# Patient Record
Sex: Female | Born: 1950 | Race: White | Hispanic: No | Smoking: Current every day smoker
Health system: Southern US, Community
[De-identification: ages and names within clinical notes are randomized; demographics above are authoritative.]

## PROBLEM LIST (undated history)

## (undated) DIAGNOSIS — R3129 Other microscopic hematuria: Secondary | ICD-10-CM

## (undated) DIAGNOSIS — M3130 Wegener's granulomatosis without renal involvement: Secondary | ICD-10-CM

## (undated) DIAGNOSIS — F17200 Nicotine dependence, unspecified, uncomplicated: Secondary | ICD-10-CM

## (undated) DIAGNOSIS — E039 Hypothyroidism, unspecified: Secondary | ICD-10-CM

## (undated) DIAGNOSIS — K5790 Diverticulosis of intestine, part unspecified, without perforation or abscess without bleeding: Secondary | ICD-10-CM

## (undated) DIAGNOSIS — E669 Obesity, unspecified: Secondary | ICD-10-CM

## (undated) HISTORY — PX: CHOLECYSTECTOMY: SHX55

## (undated) HISTORY — DX: Diverticulosis of intestine, part unspecified, without perforation or abscess without bleeding: K57.90

## (undated) HISTORY — DX: Other microscopic hematuria: R31.29

## (undated) HISTORY — PX: TONSILLECTOMY: SHX5217

## (undated) HISTORY — DX: Wegener's granulomatosis without renal involvement: M31.30

## (undated) HISTORY — PX: APPENDECTOMY: SHX54

## (undated) HISTORY — DX: Hypothyroidism, unspecified: E03.9

## (undated) HISTORY — DX: Obesity, unspecified: E66.9

## (undated) HISTORY — DX: Nicotine dependence, unspecified, uncomplicated: F17.200

## (undated) HISTORY — PX: LUNG SURGERY: SHX703

---
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
## 2001-11-07 ENCOUNTER — Emergency Department (HOSPITAL_COMMUNITY): Admission: EM | Admit: 2001-11-07 | Discharge: 2001-11-07 | Payer: Self-pay | Admitting: Emergency Medicine

## 2001-11-15 ENCOUNTER — Encounter: Admission: RE | Admit: 2001-11-15 | Discharge: 2001-11-15 | Payer: Self-pay | Admitting: Internal Medicine

## 2001-12-17 ENCOUNTER — Encounter: Admission: RE | Admit: 2001-12-17 | Discharge: 2001-12-17 | Payer: Self-pay | Admitting: Internal Medicine

## 2002-01-09 ENCOUNTER — Encounter: Admission: RE | Admit: 2002-01-09 | Discharge: 2002-01-09 | Payer: Self-pay | Admitting: Internal Medicine

## 2002-01-16 ENCOUNTER — Encounter: Admission: RE | Admit: 2002-01-16 | Discharge: 2002-01-16 | Payer: Self-pay | Admitting: Internal Medicine

## 2002-06-21 ENCOUNTER — Encounter: Admission: RE | Admit: 2002-06-21 | Discharge: 2002-06-21 | Payer: Self-pay | Admitting: Family Medicine

## 2002-06-21 ENCOUNTER — Encounter: Payer: Self-pay | Admitting: Family Medicine

## 2003-07-22 ENCOUNTER — Emergency Department (HOSPITAL_COMMUNITY): Admission: EM | Admit: 2003-07-22 | Discharge: 2003-07-23 | Payer: Self-pay | Admitting: Emergency Medicine

## 2003-08-04 ENCOUNTER — Emergency Department (HOSPITAL_COMMUNITY): Admission: EM | Admit: 2003-08-04 | Discharge: 2003-08-04 | Payer: Self-pay | Admitting: Emergency Medicine

## 2003-09-25 ENCOUNTER — Other Ambulatory Visit: Admission: RE | Admit: 2003-09-25 | Discharge: 2003-09-25 | Payer: Self-pay

## 2005-06-01 ENCOUNTER — Encounter: Admission: RE | Admit: 2005-06-01 | Discharge: 2005-06-01 | Payer: Self-pay | Admitting: Family Medicine

## 2005-09-09 ENCOUNTER — Other Ambulatory Visit: Admission: RE | Admit: 2005-09-09 | Discharge: 2005-09-09 | Payer: Self-pay | Admitting: Family Medicine

## 2005-09-14 ENCOUNTER — Encounter: Admission: RE | Admit: 2005-09-14 | Discharge: 2005-09-14 | Payer: Self-pay | Admitting: Family Medicine

## 2007-03-08 ENCOUNTER — Encounter: Admission: RE | Admit: 2007-03-08 | Discharge: 2007-03-08 | Payer: Self-pay | Admitting: Family Medicine

## 2007-03-13 ENCOUNTER — Encounter: Admission: RE | Admit: 2007-03-13 | Discharge: 2007-03-13 | Payer: Self-pay | Admitting: Family Medicine

## 2007-05-31 DIAGNOSIS — M3130 Wegener's granulomatosis without renal involvement: Secondary | ICD-10-CM

## 2007-05-31 HISTORY — PX: VIDEO ASSISTED THORACOSCOPY (VATS)/THOROCOTOMY: SHX6173

## 2007-05-31 HISTORY — DX: Wegener's granulomatosis without renal involvement: M31.30

## 2007-07-25 ENCOUNTER — Emergency Department (HOSPITAL_COMMUNITY): Admission: EM | Admit: 2007-07-25 | Discharge: 2007-07-25 | Payer: Self-pay | Admitting: Family Medicine

## 2007-07-26 ENCOUNTER — Emergency Department (HOSPITAL_COMMUNITY): Admission: EM | Admit: 2007-07-26 | Discharge: 2007-07-26 | Payer: Self-pay | Admitting: Family Medicine

## 2007-09-04 ENCOUNTER — Encounter: Admission: RE | Admit: 2007-09-04 | Discharge: 2007-09-04 | Payer: Self-pay | Admitting: Family Medicine

## 2007-09-12 ENCOUNTER — Ambulatory Visit: Payer: Self-pay | Admitting: Pulmonary Disease

## 2007-09-12 ENCOUNTER — Inpatient Hospital Stay (HOSPITAL_COMMUNITY): Admission: EM | Admit: 2007-09-12 | Discharge: 2007-09-18 | Payer: Self-pay | Admitting: Emergency Medicine

## 2007-09-13 ENCOUNTER — Encounter: Payer: Self-pay | Admitting: Pulmonary Disease

## 2007-09-13 ENCOUNTER — Encounter (INDEPENDENT_AMBULATORY_CARE_PROVIDER_SITE_OTHER): Payer: Self-pay | Admitting: Interventional Radiology

## 2007-09-14 ENCOUNTER — Encounter: Payer: Self-pay | Admitting: Pulmonary Disease

## 2007-09-17 ENCOUNTER — Encounter (INDEPENDENT_AMBULATORY_CARE_PROVIDER_SITE_OTHER): Payer: Self-pay | Admitting: Interventional Radiology

## 2007-09-17 ENCOUNTER — Encounter: Payer: Self-pay | Admitting: Pulmonary Disease

## 2007-09-19 ENCOUNTER — Telehealth: Payer: Self-pay | Admitting: Emergency Medicine

## 2007-09-20 ENCOUNTER — Telehealth: Payer: Self-pay | Admitting: Pulmonary Disease

## 2007-09-27 ENCOUNTER — Ambulatory Visit: Payer: Self-pay | Admitting: Pulmonary Disease

## 2007-09-27 ENCOUNTER — Encounter: Payer: Self-pay | Admitting: Pulmonary Disease

## 2007-09-27 DIAGNOSIS — J984 Other disorders of lung: Secondary | ICD-10-CM | POA: Insufficient documentation

## 2007-09-27 DIAGNOSIS — J90 Pleural effusion, not elsewhere classified: Secondary | ICD-10-CM | POA: Insufficient documentation

## 2007-09-27 LAB — CONVERTED CEMR LAB
Angiotensin 1 Converting Enzyme: 27 U/L
Anti Nuclear Antibody(ANA): NEGATIVE
Rheumatoid fact SerPl-aCnc: 223.2 [IU]/mL — ABNORMAL HIGH

## 2007-10-09 ENCOUNTER — Telehealth: Payer: Self-pay | Admitting: Pulmonary Disease

## 2007-10-11 ENCOUNTER — Other Ambulatory Visit: Admission: RE | Admit: 2007-10-11 | Discharge: 2007-10-11 | Payer: Self-pay | Admitting: Obstetrics and Gynecology

## 2007-10-17 ENCOUNTER — Encounter: Payer: Self-pay | Admitting: Pulmonary Disease

## 2007-10-25 ENCOUNTER — Ambulatory Visit: Admission: RE | Admit: 2007-10-25 | Discharge: 2007-10-25 | Payer: Self-pay | Admitting: Pulmonary Disease

## 2007-10-26 ENCOUNTER — Telehealth: Payer: Self-pay | Admitting: Pulmonary Disease

## 2007-10-29 ENCOUNTER — Telehealth: Payer: Self-pay | Admitting: Pulmonary Disease

## 2007-10-30 ENCOUNTER — Telehealth (INDEPENDENT_AMBULATORY_CARE_PROVIDER_SITE_OTHER): Payer: Self-pay | Admitting: *Deleted

## 2007-11-06 ENCOUNTER — Ambulatory Visit: Payer: Self-pay | Admitting: Thoracic Surgery

## 2007-11-09 ENCOUNTER — Inpatient Hospital Stay (HOSPITAL_COMMUNITY): Admission: RE | Admit: 2007-11-09 | Discharge: 2007-11-14 | Payer: Self-pay | Admitting: Thoracic Surgery

## 2007-11-09 ENCOUNTER — Ambulatory Visit: Payer: Self-pay | Admitting: Pulmonary Disease

## 2007-11-09 ENCOUNTER — Ambulatory Visit: Payer: Self-pay | Admitting: Thoracic Surgery

## 2007-11-09 ENCOUNTER — Encounter: Payer: Self-pay | Admitting: Thoracic Surgery

## 2007-11-13 ENCOUNTER — Encounter: Payer: Self-pay | Admitting: Pulmonary Disease

## 2007-11-14 ENCOUNTER — Telehealth: Payer: Self-pay | Admitting: Pulmonary Disease

## 2007-11-14 DIAGNOSIS — M3130 Wegener's granulomatosis without renal involvement: Secondary | ICD-10-CM | POA: Insufficient documentation

## 2007-11-15 ENCOUNTER — Telehealth: Payer: Self-pay | Admitting: Pulmonary Disease

## 2007-11-16 ENCOUNTER — Telehealth (INDEPENDENT_AMBULATORY_CARE_PROVIDER_SITE_OTHER): Payer: Self-pay | Admitting: *Deleted

## 2007-11-19 ENCOUNTER — Telehealth (INDEPENDENT_AMBULATORY_CARE_PROVIDER_SITE_OTHER): Payer: Self-pay | Admitting: *Deleted

## 2007-11-20 ENCOUNTER — Telehealth: Payer: Self-pay | Admitting: Pulmonary Disease

## 2007-11-21 ENCOUNTER — Ambulatory Visit: Payer: Self-pay | Admitting: Thoracic Surgery

## 2007-11-21 ENCOUNTER — Encounter: Admission: RE | Admit: 2007-11-21 | Discharge: 2007-11-21 | Payer: Self-pay | Admitting: Thoracic Surgery

## 2007-11-22 ENCOUNTER — Ambulatory Visit: Payer: Self-pay | Admitting: Pulmonary Disease

## 2007-11-27 ENCOUNTER — Telehealth (INDEPENDENT_AMBULATORY_CARE_PROVIDER_SITE_OTHER): Payer: Self-pay | Admitting: *Deleted

## 2007-11-28 ENCOUNTER — Ambulatory Visit: Payer: Self-pay | Admitting: Hematology & Oncology

## 2007-11-29 ENCOUNTER — Encounter: Payer: Self-pay | Admitting: Pulmonary Disease

## 2007-11-29 ENCOUNTER — Telehealth (INDEPENDENT_AMBULATORY_CARE_PROVIDER_SITE_OTHER): Payer: Self-pay | Admitting: *Deleted

## 2007-11-29 LAB — CONVERTED CEMR LAB
Basophils Absolute: 0 10*3/uL (ref 0.0–0.1)
Chloride: 101 meq/L (ref 96–112)
Eosinophils Absolute: 0.1 10*3/uL (ref 0.0–0.7)
GFR calc non Af Amer: 92 mL/min
MCHC: 34.1 g/dL (ref 30.0–36.0)
MCV: 97 fL (ref 78.0–100.0)
Neutrophils Relative %: 54.1 % (ref 43.0–77.0)
Platelets: 600 10*3/uL — ABNORMAL HIGH (ref 150–400)
Potassium: 3.9 meq/L (ref 3.5–5.1)
RDW: 15.3 % — ABNORMAL HIGH (ref 11.5–14.6)
Sodium: 138 meq/L (ref 135–145)
WBC: 9.2 10*3/uL (ref 4.5–10.5)

## 2007-12-04 ENCOUNTER — Ambulatory Visit (HOSPITAL_COMMUNITY): Admission: RE | Admit: 2007-12-04 | Discharge: 2007-12-04 | Payer: Self-pay | Admitting: Pulmonary Disease

## 2007-12-04 ENCOUNTER — Encounter: Payer: Self-pay | Admitting: Pulmonary Disease

## 2007-12-06 ENCOUNTER — Telehealth (INDEPENDENT_AMBULATORY_CARE_PROVIDER_SITE_OTHER): Payer: Self-pay | Admitting: *Deleted

## 2007-12-11 ENCOUNTER — Telehealth (INDEPENDENT_AMBULATORY_CARE_PROVIDER_SITE_OTHER): Payer: Self-pay | Admitting: *Deleted

## 2007-12-18 ENCOUNTER — Encounter: Payer: Self-pay | Admitting: Pulmonary Disease

## 2008-07-16 IMAGING — CT CT CHEST W/ CM
2 of 3 series · 15 of 36 positions shown, 18 images · IV contrast (agent unspecified)
Comparison: Previous chest radiographs, the most recent dated
09/27/2007.

CLINICAL DATA: Chest pain shortness of breath.  Chest masses on
recent chest radiographs.

CT CHEST WITH CONTRAST
TECHNIQUE: Multidetector CT imaging of the chest was performed
following the standard protocol during bolus administration of
intravenous contrast.
Contrast: 80 ml Kmnipaque-TQQ

[Series 2: chest routine 5.0 b40f · axial · 0.67mm/px · z∈[-324,-34]mm · 12 of 68 slices shown, 15 images]
[im 5/68  mediastinal]
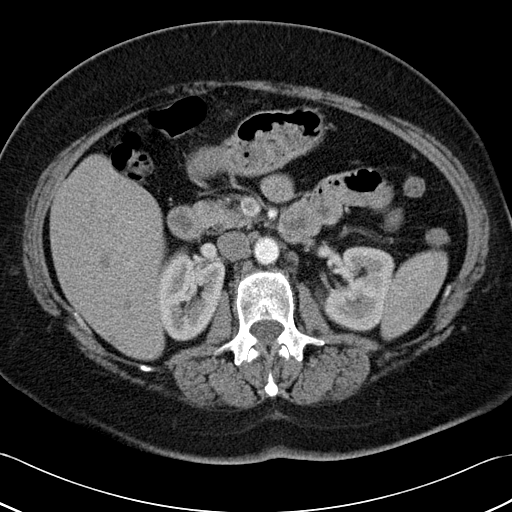
[im 5/68  lung]
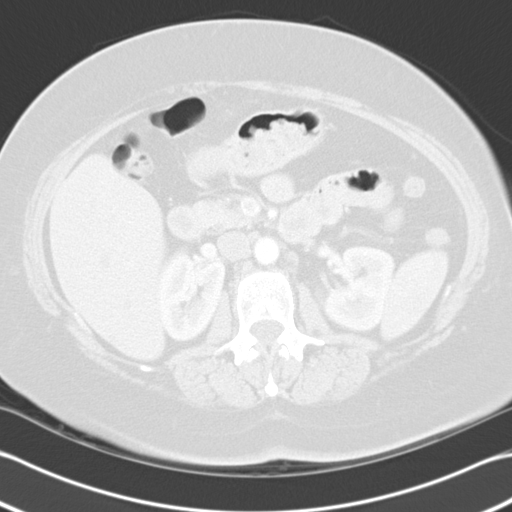
[im 10/68  lung]
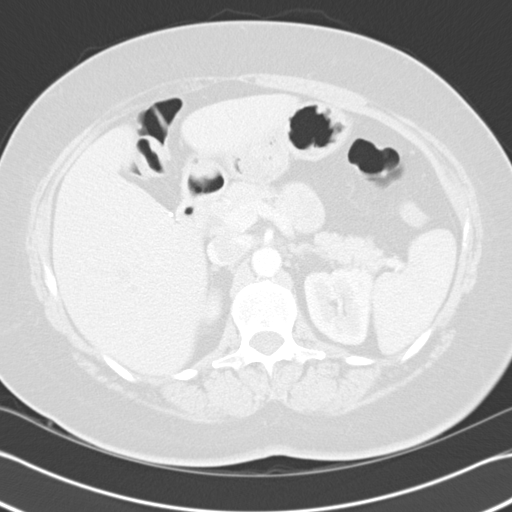
[im 15/68  lung]
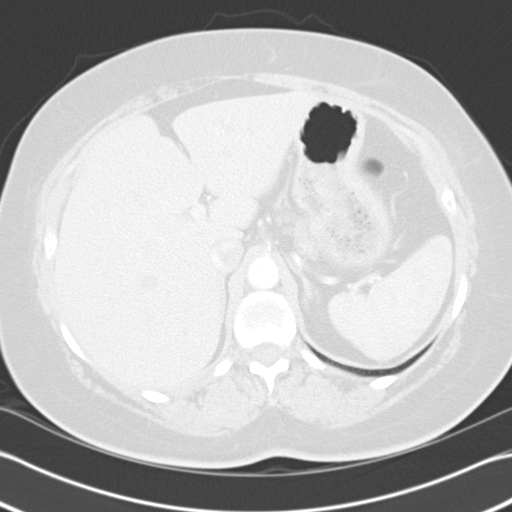
[im 20/68  lung]
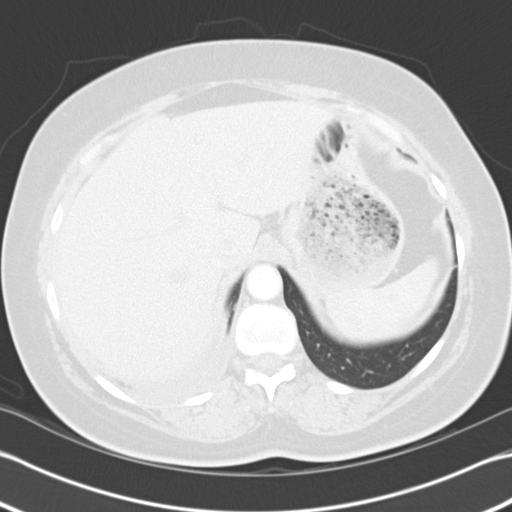
[im 25/68  mediastinal]
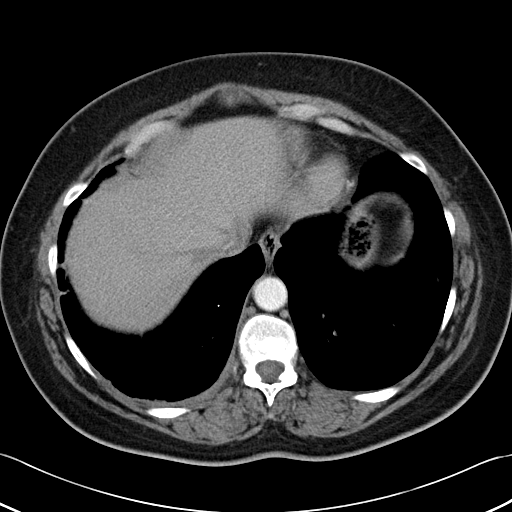
[im 25/68  lung]
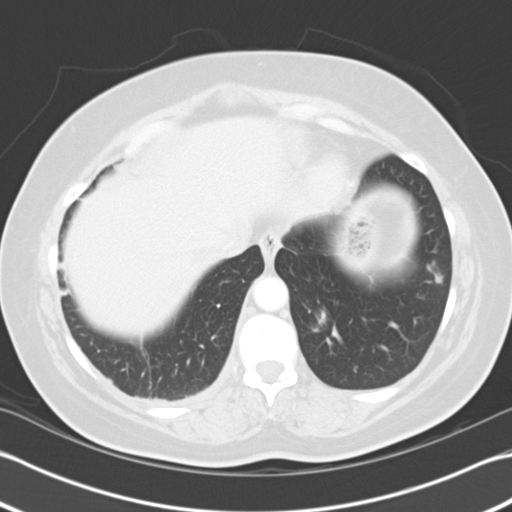
[im 30/68  lung]
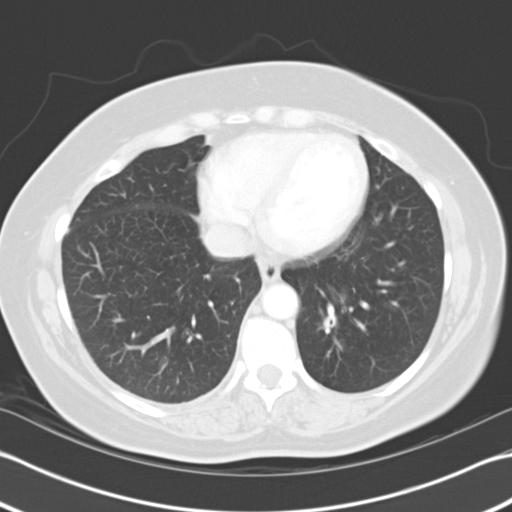
[im 38/68  lung]
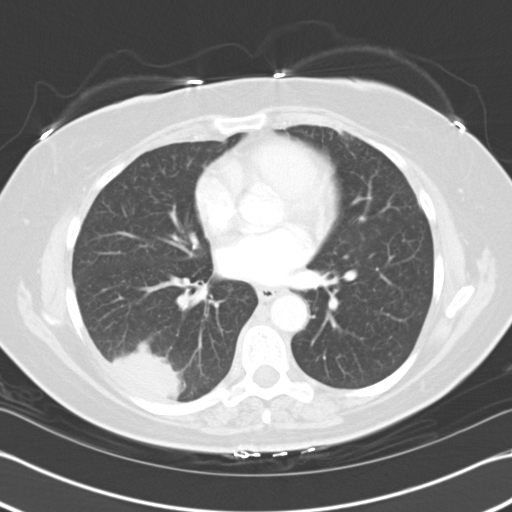
[im 43/68  lung]
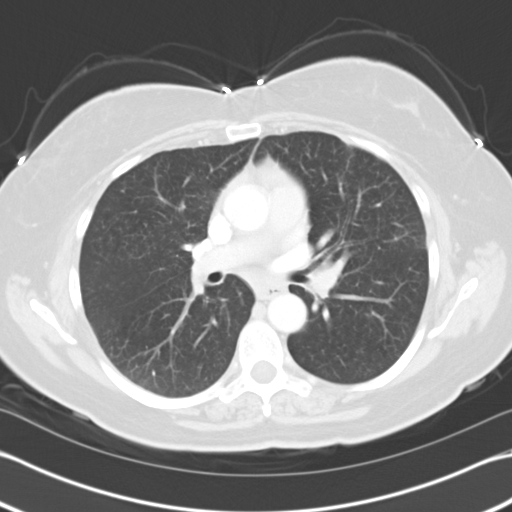
[im 48/68  mediastinal]
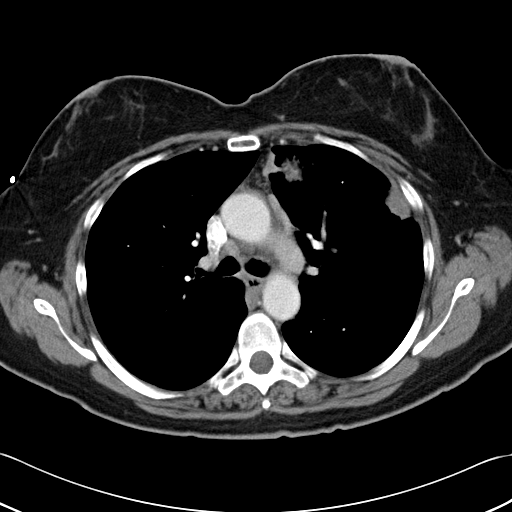
[im 48/68  lung]
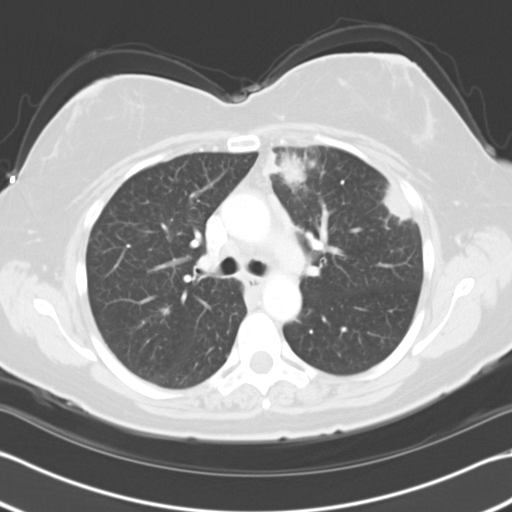
[im 53/68  lung]
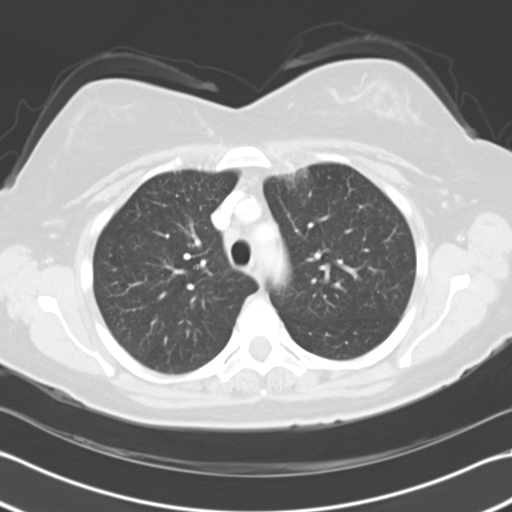
[im 58/68  lung]
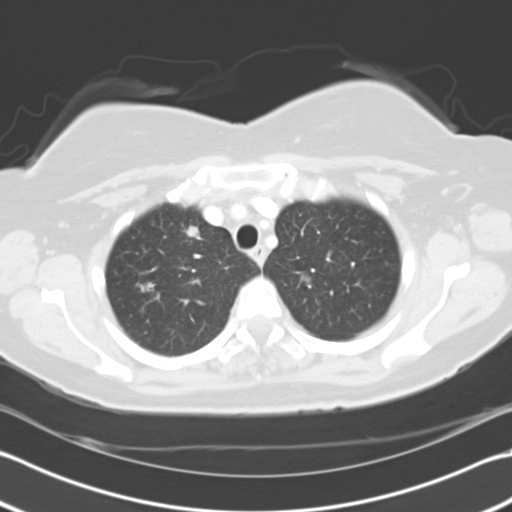
[im 63/68  lung]
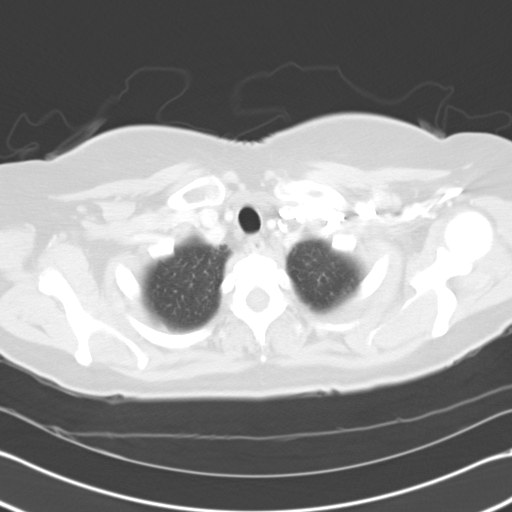

[Series 602: <mpr thick range> · coronal · 0.67mm/px · 3 of 87 slices shown]
[im 18/87  lung]
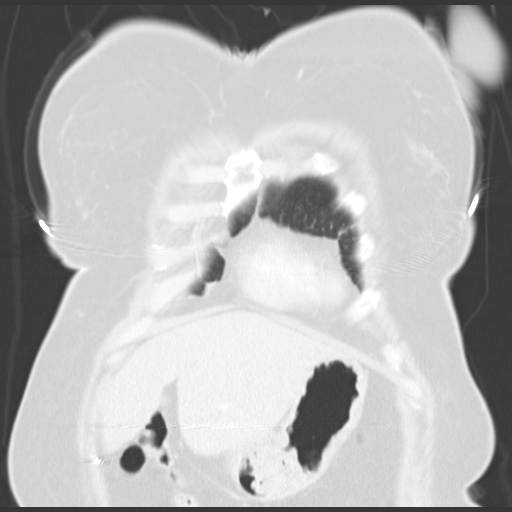
[im 35/87  lung]
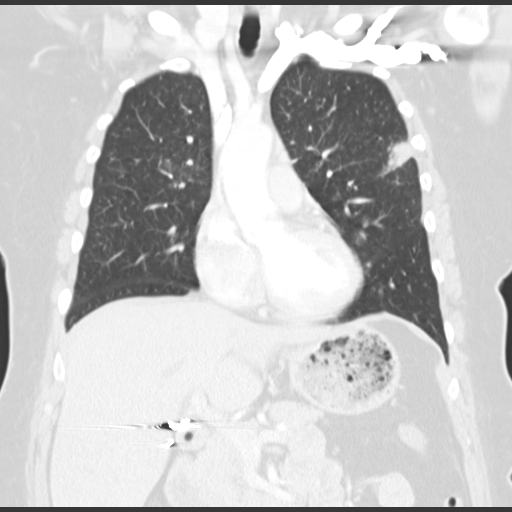
[im 52/87  lung]
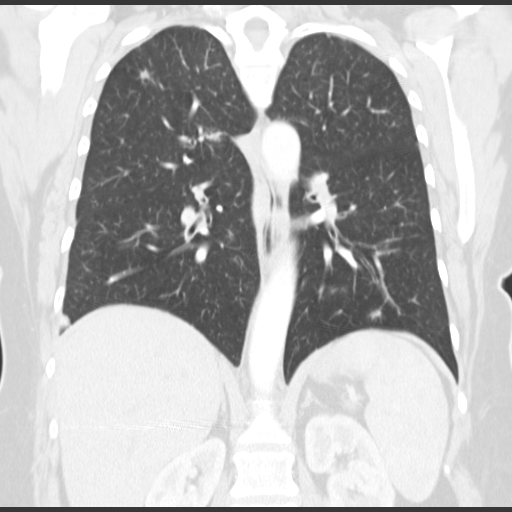

[15 of 36 positions shown; findings below may reference images not displayed]

FINDINGS: Multiple bilateral, irregularly marginated lung masses.
The largest mass on the right is in the posterior subpleural region
of the right lower lobe.  This mass measures 5.2 x 2.7 cm in
maximum dimensions on image number 32.  The largest mass on the
left is in the in the anterior aspect of the left upper lobe,
measuring 3.6 x 2.5 cm in maximum dimensions on image number 20.
No enlarged lymph nodes are seen.  Small amount of right pleural
fluid.  Cholecystectomy clips.  Normal appearing adrenal glands and
included portions of the the liver.  Mild thoracic spine
degenerative changes.
IMPRESSION: 1.  Multiple bilateral, irregularly marginated lung masses.  These
are suspicious for metastases.  An infectious process, such as
fungal disease, is also a possibility.
2.  Small right pleural effusion.

## 2008-08-19 ENCOUNTER — Ambulatory Visit (HOSPITAL_COMMUNITY): Admission: RE | Admit: 2008-08-19 | Discharge: 2008-08-19 | Payer: Self-pay | Admitting: Unknown Physician Specialty

## 2010-06-20 ENCOUNTER — Encounter: Payer: Self-pay | Admitting: Family Medicine

## 2010-10-12 NOTE — Assessment & Plan Note (Signed)
OFFICE VISIT   [name], [name]  DOB:  [birth year]                                        November 21, 2007  CHART #:  [id number]   REASON FOR OFFICE VISIT:  Followup appointment status post right VATS,  right minithoracotomy with wedge resection.  Biopsies of right upper,  right middle, and right lower lobe, nodules with lysis of adhesions by  Dr. Edwyna Shell on November 09, 2007.   HISTORY OF PRESENT ILLNESS:  This is a pleasant 60 year old Caucasian  female patient of Dr. Felipa Eth, who had complaints of worsening dyspnea.  Please see medical records for detailed history.   The patient was found to have multiple pulmonary nodules on the right  lung and underwent the aforementioned lung procedure.  Frozen section  revealed an inflammatory process.  Final pathology revealed Wegener's  granulomatosis.  The patient did fairly well postoperatively.  Since  having been discharged home from the hospital, the patient does have  some complaints of incisional pain, but denies any shortness of breath,  chest pain, fever, chills, or night sweats.   PHYSICAL EXAMINATION:  GENERAL:  This is a pleasant 60 year old  Caucasian female in no acute distress.  She is alert and cooperative.  VITAL SIGNS:  BP 143/85, pulse rate 99, respiration rate 24, and O2 sat  95% on room air.  CARDIOVASCULAR:  Slightly tachycardiac.  PULMONARY:  Clear to auscultation bilaterally.  No rales, wheezes, or  rhonchi.  EXTREMITIES:  No cyanosis, clubbing, or edema.  CHEST:  Right chest wound well healed.  Several chest tube sutures are  removed.  Scabs present over chest tube site.   IMAGING STUDIES:  Chest x-ray done today reveals improved aeration of  the right mid and lower lung, improvement of subcutaneous air in the  right, and no pneumothorax.   IMPRESSION AND PLAN:  1. Surgically stable, status post right VATS, right minithoracotomy      for dissection and biopsy of the right upper, middle, and  lower      lobes.  2. Wegener granulomatosis.  The patient has recently been placed on      prednisone 60 mg p.o. daily.  The patient has a return appointment      to see Dr. Felipa Eth on November 23, 2007.  The patient was instructed that      she may drive.  She is not to swim for several weeks.  She is to      continue her incentive spirometer and walk daily and increase her      frequency and duration as tolerated.  She may return to work on a      part-time basis for the next week and then return full-time      thereafter.  She is to contact our office for any further questions      or concerns.   Ines Bloomer, M.D.  Electronically Signed   DZ/MEDQ  D:  11/21/2007  T:  11/22/2007  Job:  [id number]   cc:   Larina Earthly, M.D.

## 2010-10-12 NOTE — Op Note (Signed)
NAMEALBERTA, LENHARD               ACCOUNT NO.:  [id number]   MEDICAL RECORD NO.:  [id number]          PATIENT TYPE:  INP   LOCATION:  2303                         FACILITY:  [facility]   PHYSICIAN:  Ines Bloomer, M.D. DATE OF BIRTH:  [birth year]   DATE OF PROCEDURE:  DATE OF DISCHARGE:                               OPERATIVE REPORT   PREOPERATIVE DIAGNOSIS:  Bilateral pulmonary nodules.   POSTOPERATIVE DIAGNOSES:  Bilateral pulmonary nodules.   OPERATION PERFORMED:  Left video-assisted thoracic surgery  minithoracotomy, wedge resection, right upper lobe nodule lysis of  adhesions, and right lower lobe nodule, and right middle lobe nodule.   SURGEON:  Ines Bloomer, MD.   General anesthesia.   FIRST ASSISTANT:  Coral Ceo, PA-C.   This patient presented with bilateral pulmonary infiltrate and nodules.  Previous biopsy showed a questionable granulomatous process.  A  thoracoscopy was made.  Her nodules progressed.  She is having a lot of  pleuritic-type chest pain particularly on the right, then previous  biopsy had been known on the left side, so we decided to do biopsies on  the right side.  She underwent general anesthesia and was turned to the  right lateral thoracotomy position.  Two trocar sites were made in the  anterior and posterior axons in the seventh intercostal space.  We  attempted to insert two trocars, but there was marked amount of  adhesions.  For this reason, we did a minithoracotomy at the fifth  intercostal space partially dividing the latissimus and spreading the  serratus, and we were able to take down that adhesions to get a scope  and there two pictures were taken of the marked adhesions.  We then used  the scope under direct vision to take down the adhesions medially in the  right upper lobe where there was a 1-cm-1.5-cm nodule which was grasped  with a uvula-tongue clamp and then resected using the Covidien stapler,  the 4.5-mm stapler.  This was  sent for frozen section.  While this was  being done, more adhesions were taken down of the middle lobe and  another lesion was seen in the middle lobe and this was resected with  the Covidien stapler with the 45 and 60 staplers.  Finally, the  adhesions were taken down in the lower lobe in the superior segment.  There was at least a 3-4 cm lesion.  We had to take the lower lobe off  the diaphragm and then off the lateral wall in order to resect the  lesion up enough to excise.  The major fissure was partially divided  with the Auto suture 60 stapler and then we resected it with several  applications of the Auto suture 60 stapler.  Several small tears of the  lung were oversewn with a 3-0 Vicryl.  All bleeding was  electrocoagulated.  Chest tubes were brought in through both chest tube  sites and tied in place with 0 silk.  Frozen section revealed  inflammatory process, which we requested to be sent to Dr. Leandro Reasoner in  [place], [place].  On-Q was placed  in the usual fashion.  The chest  was  closed with two pericostal drilling through the sixth rib and passed  around the fifth rib, #1 Vicryl in the muscle layer.  Marcaine block was  done in the usual fashion and Dermabond for the skin.  The patient was  returned to recovery room in stable condition.      Ines Bloomer, M.D.  Electronically Signed     DPB/MEDQ  D:  11/09/2007  T:  11/10/2007  Job:  [id number]   cc:   Larina Earthly, M.D.

## 2010-10-12 NOTE — Discharge Summary (Signed)
[name], [name]               ACCOUNT NO.:  [id number]   MEDICAL RECORD NO.:  [id number]          PATIENT TYPE:  INP   LOCATION:  3301                         [facility]AC[name]   PHYSICIAN:  [name], M.D. DATE OF BIRTH:  [birth year]   DATE OF ADMISSION:  11/09/2007  DATE OF DISCHARGE:  11/14/2007                               DISCHARGE SUMMARY   ADMITTING DIAGNOSES:  1. Bilateral pulmonary nodules.  2. History of hypothyroidism.   DISCHARGE DIAGNOSES:  1. Bilateral pulmonary nodules.  2. History of hypothyroidism.   PROCEDURE:  Right video-assisted thoracic surgery, right mini  thoracotomy with wedge resection, biopsies of the right upper, right  middle, and right lower lobe nodules with lysis of adhesions by Dr.  Edwyna Bailey on November 09, 2007.   HOSPITAL COURSE STAY:  This is a 60 year old Caucasian female, patient  of Dr. Felipa Bailey, who according to medical records had repeated episodes of  bronchitis over the past several months.  She was treated with  antibiotics.  She eventually saw Dr. Laurine Bailey for worsening dyspnea, and  a spiral CT of the chest was done.  There was a 2-cm pleural base mass  found in the left upper lobe and a small air spiculation in the right  upper lobe as well as a small-to-moderate pleural effusion with  atelectasis at the right lower lobe.  She underwent thoracentesis of the  right effusion which revealed exudate with eosinophils.  She was  subsequently found to have multiple pulmonary nodules, and a biopsy of  the left upper lobe nodule was consistent with non-necrotizing  granulomatous inflammation.  Fungal, AFB, and PPD were negative.  Rheumatoid factor was positive.  She had a repeat CAT scan done on Oct 17, 2007, which showed a small right pleural effusion, bilateral  pulmonary nodules (the largest being in the right lower lobe).  She was  admitted to [facility] on November 09, 2007, to undergo the aforementioned  right lung surgery.   Postoperatively, the patient remained  hemodynamically stable.  Her posterior chest tube was removed.  On November 11, 2007, followup x-ray revealed a tiny right apical pneumothorax.  The  patient had complaints of itching she felt secondary to the fentanyl  PCA.  She was given Atarax for this.  She also had complaints of  nauseousness on postoperative day #3.  This was relieved with Zofran.  PCA was discontinued on the patient's request on postoperative day #3.  X-ray again on this day revealed a stable tiny right apical pneumothorax  as well as some minimal right subcutaneous emphysema, also noted was a  left focal density.  The patient complains of a nonproductive cough on  postoperative day #4 as well as some incisional pain.  She had Percocet,  Ultram, and Darvocet as needed for pain; however, the patient preferred  Darvocet.  In addition, the patient was found to have a potassium of  3.5.  This was supplemented.  Again, she remains afebrile.  Vital signs  were stable.  She is mostly going to be discharged on November 14, 2007.  Latest chest  x-ray done today again revealed a tiny stable right apical  pneumothorax, bibasilar atelectasis, small amount of subcutaneous  emphysema on the right and a density at the left mid lung zone as seen  previously.  Latest laboratory studies revealed the CBC done today H&H  is stable at 9.9 and 29 respectively, white count of 9500, and platelet  count 274,000.  BMET done today, potassium 3.5, sodium 134, BUN and  creatinine 0.6 and 3 respectively.   DISCHARGE INSTRUCTIONS:  Include the following:  The patient is to walk  daily and continue to use her incentive spirometer.  She may shower.  She is not to bathe.  She is not to lift or drive for 3 weeks.  Followup  appointments include Dr. Edwyna Bailey on November 21, 2007.  Prior to this  appointment, a chest x-ray will be taken.  In addition, she is to follow  up with Dr. Felipa Bailey in 1-2 weeks.  She needs to call his  office for an  appointment.   DISCHARGE MEDICATIONS:  Include the following:  1. Vitamin C 1000 mg p.o. daily.  2. Fish oil 1000 mg p.o. daily.  3. Prenatal vitamin p.o. q.a.m.  4. Levothyroxine 125 mcg p.o. daily.  5. Celebrex 200 mg p.o. twice daily.  6. Darvocet-N 100 1-2 tablets p.o. q.4-6h. p.r.n. pain.      [name], [place]      [name], M.D.  Electronically Signed    DZ/MEDQ  D:  11/13/2007  T:  11/14/2007  Job:  [id number]   cc:   [name], M.D.

## 2010-10-12 NOTE — Consult Note (Signed)
NAMEBRITISH, [name]               ACCOUNT NO.:  [id number]   MEDICAL RECORD NO.:  [id number]          PATIENT TYPE:  INP   LOCATION:  4710                         FACILITY:  [facility]   PHYSICIAN:  Oretha Milch, MD      DATE OF BIRTH:  [birth year]   DATE OF CONSULTATION:  09/14/2007  DATE OF DISCHARGE:                                 CONSULTATION   REFERRING PHYSICIAN:  Kela Millin, M.D.   REASON FOR CONSULTATION:  Pulmonary nodule.   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  She has been  struggling with repeated episodes of bronchitis over the past few  months, which has been treated with antibiotics.  She presented to  urgent care on July 25, 2007.  A chest x-ray done showed a density  in the right lower lung zone, which was felt to be a summation shadow.  She saw Dr. Paulino Rily for worsening dyspnea and a spiral CT chest was  performed.  I have reviewed these images from the [facility].  The patient has a CD-ROM with her, this did not show any  intraluminal filling defects, but showed a 2.0 cm pleural-based mass in  the left upper lobe.  There is a small area of spiculation in the right  upper lobe seen on image 12.  A small-to-moderate right pleural effusion  was seen with some atelectasis of the right lower lobe.  She  subsequently underwent ultrasound-guided thoracentesis of this right  effusion after admission to the hospital.  Pleural fluid results showed  protein level of 4.4, an LDH of 272, glucose of 104.  There were 9505  white cells with 37% neutrophils, 30% lymphocytes, and 28% eosinophils.  Some mesothelial cells were also present.  Culture was negative and  pleural fluid triglyceride level was 43.  Amylase level was 41.  CEA  level was less than 0.5 and CA-125 level was elevated at 114.  ESR was  56.   The patient reports that her cough has improved.  She denies  hemoptysis,  anorexia, fevers, or weight loss.  She reports a nagging pain in the  right lower chest area.   PAST MEDICAL HISTORY:  Includes an abnormal mammogram with normal  followup ultrasound on September 04, 2007.   PAST SURGICAL HISTORY:  None.   ALLERGIES:  CODEINE.   MEDICATIONS AT HOME:  1. Synthroid 125 mcg daily.  2. Multivitamin.   CURRENT MEDICATIONS:  1. Atrovent and albuterol nebs q.8 h. p.r.n.  2. Nicotine 21-mg patch.  3. Protonix 40 mg p.o. daily.  4. Hydrocodone APAP q.4 h. p.r.n. for pain.   SOCIAL HISTORY:  She smoked pack and a half per day for many years.  Over the past few months, she has cut down to about half a pack per day.  Denies alcohol use.  She works as a Investment banker, corporate.   PHYSICAL EXAMINATION:  GENERAL:  A dull woman sitting up in bed in no  apparent respiratory distress.  VITAL SIGNS:  Respirations 18  per minute, heart rate 84 per minute,  oxygen saturation 97% on room air and blood pressure 132/84.  HEENT:  No thrush.  NECK:  Supple.  No JVD, no lymphadenopathy.  CVS:  S1, S2 normal.  CHEST:  Decreased breath sounds both bases.  No rhonchi.  ABDOMEN:  Soft, nontender.  No organomegaly.  NEUROLOGIC:  Nonfocal.  EXTREMITIES:  No edema.  No clubbing.   LABORATORY DATA:  WBC count 7.4, hemoglobin 13.3, platelets 256, 60%  neutrophils, 22% lymphocytes.  INR 0.9.  BUN and creatinine was 5 and  0.66, calcium was 8.6.  Sodium 140, potassium 4.2, bicarbonate 29.   IMAGING STUDIES:  As described above.   IMPRESSION:  1. Exudative right pleural effusion with increased eosinophils.  2. Left upper lobe, 2.0-cm pleural-based nodule as described.  3. Spiculated lesion in the right upper lobe.   The differential diagnosis of this appearance is very suspicious for  metastatic malignancy, especially in the setting of an elevated CA-125  level.  Her CT abdomen results are pending.  I discussed the  radiographic appearance and the possibilities with the  patient in great  detail.  1. I will await a pleural fluid cytology report.  If that is negative,      we will pursue a CT-guided aspiration of left upper lobe mass.  2. Risks of the procedure including coughing, bleeding, and a small      chance of lung puncture requiring chest tube were discussed with      the patient in great detail, and she evidenced understanding.      Oretha Milch, MD  Electronically Signed     RVA/MEDQ  D:  09/14/2007  T:  09/15/2007  Job:  [id number]

## 2010-10-12 NOTE — Discharge Summary (Signed)
[name], [name]               ACCOUNT NO.:  [id number]   MEDICAL RECORD NO.:  [id number]          PATIENT TYPE:  OUT   LOCATION:  PULM                         FACILITY:  [facility]   PHYSICIAN:  Ramiro Harvest, MD    DATE OF BIRTH:  [birth year]   DATE OF ADMISSION:  12/04/2007  DATE OF DISCHARGE:  09/18/2007                               DISCHARGE SUMMARY   PRIMARY CARE PHYSICIAN:  Dr. Johnn Hai of [facility].   DISCHARGE DIAGNOSES:  1. Left upper lobe lung mass/right exudative pleural effusion.  2. Hypothyroidism.  3. Tobacco abuse.   DISCHARGE MEDICATIONS:  1. Synthroid 0.125 mg p.o. daily.  2. Prenatal vitamins p.o. daily.  3. Vicodin 5/500 one tablet p.o. every 4 hours p.r.n.  4. Nicotine patch  21 mg TD daily.  5. Albuterol Nebs every 4 hours p.r.n.   DISPOSITION AND FOLLOWUP:  Patient will be discharged home.  Patient is  to follow up with Dr. Vassie Loll of [facility] on Sep 28, 2007 at 1:30 p.m. for followup of pathology results of her CT guided  biopsy results of her left upper lobe mass.  Patient will also likely  need PFTs done and smoking cessation done as well, and also will be  followed up with Dr. Vassie Loll in terms of the right pleural effusion and  spiculated upper lobe mass.  Patient will need followup on this left  upper lobe mass and pathology results on the CT guided  biopsy of this  mass.   CONSULTATIONS:  1. Interventional Radiology was consulted on September 17, 2007 for CT      guided biopsy of the mass.  2. Pulmonary Medicine was consulted on September 14, 2007.  Patient was      seen in consultation by Dr. Vassie Loll.   PROCEDURES PERFORMED:  1. A chest x-ray was performed on September 13, 2007 that showed a new,      small, moderate right pleural effusion with basilar atelectasis,      possibly new left lung pulmonary nodal versus  artifact overlying      the left 7th rib.  Attention on followup.  2. Another chest x-ray was obtained on September 13, 2007, post      thoracentesis that showed a decreased amount of right pleural      effusion with no evidence of pneumothorax, nodular pulmonary      opacity in the lower right lung zone is seen.  Vague nodular      density seen on the left.  Suggest CT examination for further      evaluation of pulmonary nodularity.  3. Bilateral chest decubitus x-rays were obtained on September 13, 2007      that showed layering right pleural effusion on dependent right      costophrenic angle does not clear, which may relate to passive      atelectasis or consolidation.  4. An ultrasound guided thoracentesis was done on September 13, 2007.  5. A CT of the abdomen and pelvis was done on September 14, 2007 that  showed a negative abdominal CT scan and unremarkable pelvic CT.  6. A CT guided biopsy of the left upper lobe mass was done on September 17, 2007.  7. Chest x-ray was also done on April 20, 22009 that showed no      pneumothorax post biopsy.   BRIEF HOSPITAL ADMISSION HISTORY AND PHYSICAL:  [name] is a  60 year old female with history of tobacco abuse and hypothyroidism, who  had for the past 3 months prior to presentation, had been having  coughing and worsening shortness of breath, which had been progressively  getting worse.  She presented to her PCP for a few times, had a chest x-  ray done with a diagnosis of bronchitis and treated accordingly.  For  the past one week prior to admission, patient started to have worsening  shortness of breath at rest at which point a CT scan was done per PE  protocol, which showed a small 2 cm nodular mass in the left pleural  base as well as a right-sided, moderate sized pleural effusion with  another 1 cm spiculated mass/nodule on the right.  Patient was informed  of the results and was sent to the emergency room for further evaluation  and admission.   PHYSICAL EXAM PER ADMITTING PHYSICIAN:  Temperature 98.0, blood pressure  140/94, respiratory  rate 18, heart rate 84, satting 96% on room air.  GENERAL:  Patient in no distress, lying on stretcher.  HEENT:  Normocephalic, atraumatic.  Pupils equal, round and reactive to  light.  Extraocular movements were intact.  Moist mucous membranes.  No  lymphadenopathy noted.  CARDIOVASCULAR:  Regular rate, rhythm.  No murmurs, rubs or gallops.  RESPIRATORY:  Decreased air movement on the right with no breath sounds  up to 1/3 from the base.  ABDOMEN:  Soft, nontender, nondistended.  EXTREMITIES:  No clubbing, cyanosis or edema.  BREASTS:  Had fibrous tissue noted bilaterally.  No overt masses noted.  No lymphadenopathy noted throughout.  NEUROLOGICALLY:  Patient was alert and oriented x3.  Cranial nerves II-  XII were grossly intact.  No focal deficits.   LABORATORY DATA:  CBC:  White count 10.2, hemoglobin 14, INR 0.9,  platelets 307, sodium 139, potassium 4.1, creatinine 0.6, alkaline  phosphatase 899, total protein 7.2, albumin 3.7, calcium 9.3.  CT scan  with an outside reading done showed no pulmonary embolism, approximately  2 cm mass, which is __________ based in the left upper lobe anteriorly  worrisome for  carcinoma.  There was moderate sized right pleural  effusion with associated atelectasis in the right lower lobe, small area  of spiculation in the right upper lobe seen on image 12, which could  represent scaring or small mas or carcinoma.  No more pulmonary nodules  or masses identified.  No lytic or plastic lesions identified.  Liver  and other abdominal viscera were grossly normal.   HOSPITAL COURSE:  1. Left upper lobe lung mass/nodule in the right pleural exudative      effusion.  Patient was admitted to the hospital, was placed on IV      fluids and hydrated as CT of the abdomen and pelvis was also      obtained.  A thoracentesis diagnosed as a therapeutic      thoracentesis, was also performed by Interventional Radiology.  PPD      was placed, CEA levels and LDH  levels were also obtained at the  time.  The pleural fluid was also sent for cytology as well.      Pleural fluid results came back, which were consistent with an      exudative effusion, protein level of 4.4, LDH of 272, glucose of      104.  There were 9505 white cells with 37% neutrophils, 30%      lymphocytes, 28% eosinophils.  Some mesothelial cells were also      present.  Culture was negative.  Pleural fluid triglyceride level      was 43, amylase level was 41, CEA level was less than 0.5.  A CA125      level was elevated at 114, a sed rate of 56.  Patient was placed on      oxygen and nebulizer treatments as needed, as well as pain      management.  Pulmonary was consulted.  Patient was seen in      consultation.  CT of the abdomen and pelvis was done with the      results as stated above, which were negative for any malignancy,      but there was a question of metastatic malignancy secondary to      elevated CA125 levels as well.  Interventional Radiology was      consulted and CT biopsy was done for the left upper lobe mass on      September 17, 2007.  Patient remained stable and it was felt patient      could be discharged home with a followup with Pulmonary Care      Medicine for pathology results on the CT guided biopsy results, and      a further evaluation and workup, and also for pulmonary function      test as well as smoking cessation.  Patient was discharged in a      stable and improved condition.  Patient was discharged on some      nebulizer treatment as needed as well as Vicodin for pain      management.   DAY OF DISCHARGE VITAL SIGNS:  Temperature was 98.4, pulse of 95, blood  pressure 129/77, respiratory rate 20, satting 92% on room air.   DISCHARGE LABORATORIES:  On day of discharge, BMET of 141, potassium  154.0, chloride 106, bicarb 27, glucose 110.  BUN 5, creatinine 0.66 and  a calcium of 8.8.   Patient was discharged in a stable and improved  condition.   It was a pleasure taking care of [name].      Ramiro Harvest, MD  Electronically Signed     DT/MEDQ  D:  12/04/2007  T:  12/04/2007  Job:  [id number]   cc:   Johnn Hai, Dr.  Oretha Milch, MD

## 2010-10-12 NOTE — Consult Note (Signed)
[name], [name]               ACCOUNT NO.:  [id number]   MEDICAL RECORD NO.:  [id number]          PATIENT TYPE:  INP   LOCATION:  2303                         FACILITY:  [facility]   PHYSICIAN:  Felipa Evener, MD  DATE OF BIRTH:  1951-01-03   DATE OF CONSULTATION:  11/09/2007  DATE OF DISCHARGE:                                 CONSULTATION   REFERRING PHYSICIAN:  Karle Plumber, M.D.   Regarding his status post right video-assisted thoracic surgery with  right minithoracotomy, right upper lobe, right middle lobe and right  lower lobe biopsies.   HISTORY OF PRESENT ILLNESS:  [name] is a 60 year old white female  a pulmonary patient of Dr. __________who has known multiple pulmonary  nodules with biopsy identification as granulosis from investigation done  on September 06, 2007.  She underwent VATS today per Dr. Edwyna Shell with nodal  biopsy of the right lung.  Pulmonary was asked to follow at this time.   PAST MEDICAL HISTORY:  1. Chronic bronchoscopy.  2. Abnormal mammogram 04/09 with a normal ultrasound followup.  3. Hypothyroidism.   ALLERGIES:  CODEINE.   MEDICATIONS:  1. Atrovent 0.5 mg.  2. Albuterol 2.5 mg nebulized every 8 hours.  3. Nicotine patch.  4. Synthroid 125 mcg daily.  5. Multivitamins daily.  6. Vicodin 5/500 twice a day p.r.n.   SOCIAL HISTORY:  She was a 1 and 1/2 pack a day smoker life-long, quit 8  weeks ago.  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   FAMILY HISTORY:  Significant for lung disease and cardiac disease.   REVIEW OF SYSTEMS:  Checked and extensive and see HPI for any  abnormalities.   GENERAL:  Well-nourished, well-developed white female currently in post  anesthesia care unit sedated with narcotics and post anesthesia  but  lucid.  VITAL SIGNS:  Blood pressure 135/76.  Heart rate is 94 and sinus, 98%  stats with 2 L nasal cannula.  Respiratory rate 16.  HEENT:  A right IJ tube, Foley catheter in place but no JVD is  appreciated.  CHEST:  Decreased breath sounds on the right side.  She has chest tubes  x2 that are hooked up to Pleur-evac drainage system with bloody drainage  and positive air leak.  Dressing on the right is dry and intact.  CARDIAC:  Heart sounds are irregularly irregular rate and rhythm.  ABDOMEN:  Soft, nontender with positive bowel sounds.  GU:  She has a Foley in place with amber urine.  EXTREMITIES:  Warm without edema, and there is a left radial arterial  line in place.   Chest x-ray says post surgical changes of chest tubes x3 with opacity of  right lower lobe and a right internal jugular central venous catheter  with the tip in the mid SVC.  Hemoglobin 13.5, hematocrit 37.8,  platelets are 264, WBC is 10.3, INR is 1.0, sodium 141, potassium 3.8,  chloride 106, CO2 is 25, BUN is 7, creatinine 0.66, glucose 123.   IMPRESSION AND PLAN:  1. Status post surgical interventions with a right VATS with right  upper lobe, right middle lobe and right lower lobe biopsies in the      setting of benign granulomatous disease.  Biopsies are pending at      this time.  This will be followed by Dr. Edwyna Shell.  2. Chronic obstructive pulmonary disease with long tobacco history.      We will continue with pulmonary toilets, she is on nebulized      bronchodilators and will  increase pulmonary toilet with flutter      valve and incentive spirometer as she becomes stronger.  3. Hypothyroidism.  She will resume her Synthroid as her home dose.  4. Tobacco abuse.  She will resume Nicoderm patches if needed.  She      has been off tobacco now for 8 weeks.  She should not require any      further Nicoderm patches but for psychological complement may be      begun.  She also may need anxiolytics with questionable Xanax.      Devra Dopp, MSN, ACNP      Felipa Evener, MD  Electronically Signed    SM/MEDQ  D:  11/09/2007  T:  11/09/2007  Job:  [id number]

## 2010-10-12 NOTE — Discharge Summary (Signed)
[name], [name]               ACCOUNT NO.:  [id number]   MEDICAL RECORD NO.:  [id number]           PATIENT TYPE:   LOCATION:                                 FACILITY:   PHYSICIAN:  Doree Fudge, PA DATE OF BIRTH:  08/10/1950   DATE OF ADMISSION:  DATE OF DISCHARGE:                               DISCHARGE SUMMARY   ADDENDUM   On frozen section, that biopsies that were taken were consisting with an  inflammatory process and final pathologies pending results that were  sent to [place], [place].  Please add this to the previously dictated  note on the patient, [name].      Doree Fudge, PA     DZ/MEDQ  D:  11/13/2007  T:  11/14/2007  Job:  [id number]

## 2010-10-12 NOTE — H&P (Signed)
[name], [name]               ACCOUNT NO.:  [id number]   MEDICAL RECORD NO.:  [id number]           PATIENT TYPE:   LOCATION:                                 FACILITY:   PHYSICIAN:  Ines Bloomer, M.D. DATE OF BIRTH:  September 17, 1950   DATE OF ADMISSION:  DATE OF DISCHARGE:                              HISTORY & PHYSICAL   CHIEF COMPLAINT:  Chest pain.   HISTORY OF PRESENT ILLNESS:  The patient is a 60 year old Caucasian  female who was admitted to the hospital with right chest pain, with  right pleural effusion, and underwent a thoracentesis which revealed  exudate with eosinophils.  She has also found to have multiple pulmonary  nodules and had a biopsy of the left upper lobe nodule, which showed  that she had nonnecrotizing granulomatous inflammation.  Fungal and AFB  studies were negative.  There was a lymphoblastic infiltrate.  I thought  it might be a hypersensitive pneumonia, aspiration, or infectious  etiology and sarcoidosis.  The rheumatoid factor is positive.  PPD is  negative.  She was a smoker about 5 cigarettes a day for many years,  quit smoking about 8 weeks ago.  She had a repeat CT scan done Oct 17, 2007, which again showed small right pleural effusion, bilateral  pulmonary nodules with the largest being in the right lower lobe.  The  previous left upper lobe nodule was also present.  She is admitted for  wedge resection of the pulmonary nodules.   PAST MEDICAL HISTORY:  Significant for hypothyroidism and tobacco abuse.   ALLERGIES:  She is allergic to CODEINE.   MEDICATIONS:  Atrovent nebulizer, albuterol nebulizer, nicotine patch,  Synthroid 125 mcg a day, and Vicodin.   FAMILY HISTORY:  Positive for emphysema and vascular disease.   SOCIAL HISTORY:  She is single, has 1 child.  She is a Investment banker, corporate  for [employer].  She does not smoke, quitting 8 weeks ago as  well as some 2 drinks per week.   REVIEW OF SYSTEMS:  VITAL SIGNS:  She is 188  pounds.  She is 5 feet 4  inches.  GENERAL:  Awake and stable.  CARDIAC:  No angina or atrial  fibrillation.  She does have midsternal chest pain.  PULMONARY:  No  hemoptysis, asthma, or wheezing.  See history of present illness.  GI:  No nausea, vomiting, constipation, or diarrhea.  GU:  No kidney disease,  dysuria, or frequent urination.  VASCULAR:  No claudication, DVT, or  TIAs.  NEUROLOGICAL:  No dizziness, headaches, blackouts, or seizures.  MUSCULOSKELETAL:  No arthritis, joint pain, muscular pain, or rash.  PSYCHIATRIC:  No depression or nervousness.  EYE/ENT:  No changes in her  eyes or hearing.  NEUROLOGIC:  No problems with bleeding or clotting  disorders or anemia.   PHYSICAL EXAMINATION:  GENERAL:  She is a well-developed Caucasian  female in no acute distress.  VITAL SIGNS:  Her blood pressure is 124/88, pulse 100, respirations 18,  saturation 95%, and temperature is 94.  HEENT:  Head is atraumatic.  Eyes:  Pupils are equal and reactive to  light and accommodation.  Extraocular movements are normal.  Ears:  Tympanic membranes intact.  Nose:  There is no septal deviation.  Throat  is without lesion.  Uvula is in the midline.  NECK:  Supple without thyromegaly.  CHEST:  Clear to auscultation and percussion.  HEART:  Regular sinus rhythm.  No murmurs.  ABDOMEN:  Obese, bowel sounds are normal.  There is no  hepatosplenomegaly, pulses are 2+.  There is no clubbing or edema.  NEUROLOGIC:  She is oriented x3.  Sensory and motor intact.  Cranial  nerves intact.   IMPRESSION:  1. Bilateral pulmonary nodules, rule out cancer, rule out infection.  2. History of hypothyroidism.  3. Tobacco abuse.   PLAN:  Right VATS resection of multiple pulmonary nodules.      Ines Bloomer, M.D.  Electronically Signed     DPB/MEDQ  D:  11/06/2007  T:  11/07/2007  Job:  [id number]

## 2010-10-12 NOTE — H&P (Signed)
NAME:  [name], [name]               ACCOUNT NO.:  [id number]   MEDICAL RECORD NO.:  [id number]          PATIENT TYPE:  INP   LOCATION:  4710                         FACILITY:  [facility]   PHYSICIAN:  Michiel Cowboy, MDDATE OF BIRTH:  [birth year]   DATE OF ADMISSION:  09/12/2007  DATE OF DISCHARGE:                              HISTORY & PHYSICAL   PRIMARY CARE PHYSICIAN:  Dr. Laurine Blazer   CHIEF COMPLAINT:  Abnormal CT scan.   HISTORY OF PRESENT ILLNESS:  The patient is a 60 year old female with a  history of tobacco abuse and hypothyroidism.  The patient had for the  past three months or so been having coughing and worsening shortness of  breath progressively.  She presented to her primary care Doy Taaffe a few  times and had chest x-ray done a few times with diagnosis of bronchitis  and treated accordingly.  For the past one week she started to have  worsening shortness of breath at rest at which point a CT scan PE  protocol was performed which showed a small 2 cm nodule/mass in the left  pleural base as well as a right-sided moderate size pleural effusion  with another 1 cm speculated mass/nodule in the right.  The patient was  informed of the results and asked to present to the emergency  department.  [facility] called to admit.   PAST MEDICAL HISTORY:  Bronchitis, hypothyroidism, tobacco abuse,  history of abnormal mammogram with a normal follow-up ultrasound in the  mammogram past six months.   REVIEW OF SYSTEMS:  The patient denies any weight loss.  She has not had  any fever or chills.  She had hot flashes years back, but not recently.  She has never had a colonoscopy done, but denies any constipation,  diarrhea.  She has occasional productive cough.  No hemoptysis.  Positive for shortness of breath and right-sided chest pain which was  worse with taking deep breaths.   ALLERGIES:  CODEINE.   MEDICATIONS:  1. Synthroid 125 mcg p.o. daily.  2. Multivitamin.   PHYSICAL EXAMINATION:  VITAL SIGNS:  Blood pressure 140/94, respirations  18, heart rate 84, temperature 98.0, saturating 96% on room air.  GENERAL:  The patient is in no distress laying down on the stretcher.  HEENT:  Head atraumatic.  No lymphadenopathy noted.  Moist mucous  membranes.  HEART:  Regular rate and rhythm with no murmurs, rubs, or gallops.  LUNGS:  Noted decreased air movement on the right with no breath sounds  up to 1/3 from the base.  ABDOMEN:  Soft, nontender, and nondistended.  BREASTS:  Fibrous tissue noted bilaterally.  No overt masses noted.  No  lymphadenopathy noted throughout.  NEUROLOGY:  The patient is intact.  EXTREMITIES:  All extremities without edema.   LABORATORY DATA:  White blood cell count 10.2, hemoglobin 14, INR 0.9,  platelets 307.  Sodium 139, potassium 4.1, creatinine 0.6, alkaline  phosphatase 99, total protein 7.2, albumin 3.7, calcium 9.3.   CT scan at outside read done today showing no pulmonary embolism.  There  is approximately 2 cm mass  which is pure based in the left upper lobe  anteriorly worrisome for carcinoma.  There is a small moderate size  right pleural effusion with associated atelectasis in the right lower  lobe.  There is small area of speculation in the right upper lobe seen  on image 12 which could represent scarring or small mass or carcinoma.  No more pulmonary nodules or masses identified.  No lytic or plastic  lesions identified.  The liver and other abdominal viscera are grossly  normal.   ASSESSMENT:  This is a 60 year old female with newly diagnosed lung mass  and pleural effusion.  1. Lung mass/nodule.  We will do nodule workup.  The patient will      eventually benefit from further imaging such as an image dedicated      to film of abdomen and pelvis to assess for malignancy, but will      not do tonight since the patient already received IV contrast      bolus.  We will hydrate overnight.  Reviewing the films,  the left-      sided nodule was pure based and possibly could be accessed for CT-      guided biopsy.  For now, we will ask radiology to perform      thoracentesis on the right and send fluid for cytology, cultures,      gram stain, cell count, differential, pH, LDH, albumin, protein,      glucose, and adenosine thiaminase.  Part of that workup, we will      also place PPD although less likely.  We will obtain sed rate,      cancer 125 marker CEA and LDH level.  The patient has had a history      of abnormal mammogram done and on repeat workup this was cleared,      but again significance of this at this point is unclear.  We may      need further imaging and further follow-up.  The patient also never      had a colonoscopy.  Pending result of cytology of pleural effusion,      this may be also considered.  We will hydrate the patient overnight      and make NPO past midnight.  2. Hypothyroidism.  We will check TSH and continue home medications.  3. Prophylaxis.  Protonix and SCDs.  Avoid Lovenox given the patient      is going to undergo an invasive procedure.  I would recommend      official consult with pulmonary in the morning for follow-up of      lung nodule.  Once cytology is back, the patient will also probably      need pending results of cytology the patient may need follow-up      with oncology if positive for malignancy.      Michiel Cowboy, MD  Electronically Signed     AVD/MEDQ  D:  09/12/2007  T:  09/12/2007  Job:  [id number]   cc:   Otilio Connors. Gerri Spore, M.D.

## 2010-10-12 NOTE — Letter (Signed)
November 06, 2007   Oretha Milch, MD  [address] [place], [place] [address]   Re:  Laurie, Bailey               DOB:  Jun 10, 1950   Dear Dr. Vassie Loll:   I saw [name] in the office today.  This is a very interesting  case.  This 60 year old Caucasian female was admitted to the hospital  complaining of right chest pain with right pleural effusion and a CT  scan had multiple pulmonary nodules.  She had an effusion that was  evacuated, it was exudative with evidence for eosinophils.  A CT biopsy  of the left upper lobe nodule showed nonnecrotizing granulomatous  inflammation with a plasmocytic infiltrate and the etiology was unknown.   Special stains for fungus, mycobacterium, and AFB were negative.  She  had a repeat CT scan of the abdomen on Oct 26, 2007, which again showed  bilateral pulmonary nodules, which was suspicious for an infectious  process and also thought to be possibly lung cancer.  The largest one  was in the right lower lobe which is 5.2 x 2.7 dimensions.  She has had  no hemoptysis with this chest pain and she quit smoking several weeks  ago, but has a long history of tobacco abuse.  She also has a positive  rheumatoid factor, and she referred here for resection of the pulmonary  nodules and to be sure that there is no cancer and to try to determine  what the diagnosis is.   ALLERGIES:  She is allergic to Coumadin.   MEDICATIONS:  Include Atrovent, albuterol, nicotine patch, Synthroid 125  mcg a day, and Vicodin.   She has a history of hypothyroidism.   FAMILY HISTORY:  Positive for lung disease and also positive for cardiac  disease.   SOCIAL HISTORY:  She is a Investment banker, corporate, she is single, has 1 child.  She quit smoking 8 weeks ago and drinks alcohol once or twice a week.   REVIEW OF SYSTEMS:  She is 5 feet 4 inches.  GENERAL:  Weight has been stable.  CARDIAC:  No angina or atrial fibrillation.  PULMONARY:  See history of present illness.  No  hemoptysis.  GI:  No nausea, vomiting, constipation, or diarrhea.  GU:  No dysuria or frequent urination.  VASCULAR:  No claudication, DVT, TIAs.  NEUROLOGIC:  No dizziness, headaches, blackouts or seizures.  MUSCULOSKELETAL:  She has had no joint pain or rash.  PSYCHIATRIC:  No depression or nervousness.  EYE/ENT:  No changes in her eye sight or hearing.  HEMATOLOGIC:  No problems with bleeding, clotting disorders, or anemia.   PHYSICAL EXAMINATION:  She is a well-developed Caucasian female in no  acute distress.  Heads, eyes, ears, nose, and throat are unremarkable.  Neck is supple without thyromegaly.  There is no supraclavicular or  axillary adenopathy.  Chest is clear to auscultation and percussion.  Heart:  Regular sinus rhythm.  No murmurs.  Her abdomen is soft.  There  is no hepatosplenomegaly.  Pulses are 2+.  There is no clubbing or  edema.  Neurologic:  She is oriented x2, sensory and motor intact.  Cranial nerves are intact.   IMPRESSION:  Bilateral pulmonary nodules, rule out metastatic disease,  rule out fungus disease, rule out tuberculosis, rule out granulomatous  disease.   PLAN:  Right VATS resection of several lung nodules.  I think this will  be the first  thing to do.  We will plan to do this resection on November 09, 2007, at [facility].  I appreciate the opportunity to see Ms.  Orvan Falconer.   Sincerely,   Ines Bloomer, M.D.  Electronically Signed   DPB/MEDQ  D:  11/06/2007  T:  11/07/2007  Job:  [id number]

## 2010-10-15 NOTE — Letter (Signed)
March 04, 2008   Emeterio Reeve, MD  [address], [address] [place], [place] [address]   Re:  GLADIOLA, MADORE                 DOB:   Dear Dr. Paulino Rily:   This is in follow up to your question regarding the patient.  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  On the right lower lobe wedge resection, there were some  squamous dysplasia in a small bronchus, which they thought was possibly  carcinoma in situ.  She gives a long history of smoking, so she could  have possibly evidence of several areas of carcinoma in situ.  This  would need to be followed with probably a surveillance bronchoscopy and  then with surveillance CT scans since she is at risk of developing a non-  small cell lung cancer.  Right now, I think she needs to continue  treatment for Wegener's granulomatosis and see how this responds, but  for the long term, she would not need to be under someone who would be  able to follow her.  I appreciate the opportunity of seeing the patient.  If you have any further questions, please feel free to call me.   Ines Bloomer, M.D.  Electronically Signed   DPB/MEDQ  D:  03/04/2008  T:  03/04/2008  Job:  [id number]

## 2011-02-18 LAB — POCT URINALYSIS DIP (DEVICE)
Bilirubin Urine: NEGATIVE
Ketones, ur: NEGATIVE
Operator id: 239701

## 2011-02-18 LAB — INFLUENZA A AND B ANTIGEN (CONVERTED LAB): Influenza B Ag: NEGATIVE

## 2011-02-22 LAB — GLUCOSE, SEROUS FLUID: Glucose, Fluid: 104

## 2011-02-22 LAB — HEPATIC FUNCTION PANEL
ALT: 13
AST: 18
Albumin: 3.7
Total Protein: 7.2

## 2011-02-22 LAB — CBC
HCT: 36.6
HCT: 37.7
Hemoglobin: 13.3
MCHC: 35.6
MCV: 97.6
MCV: 97.8
Platelets: 307
RBC: 3.74 — ABNORMAL LOW
RBC: 3.83 — ABNORMAL LOW
RDW: 13.1
WBC: 10.2
WBC: 7.4
WBC: 9.1

## 2011-02-22 LAB — DIFFERENTIAL
Basophils Absolute: 0
Basophils Absolute: 0.1
Basophils Relative: 1
Eosinophils Absolute: 0.6
Eosinophils Relative: 5
Lymphocytes Relative: 23
Lymphs Abs: 2.4
Monocytes Absolute: 0.7
Monocytes Absolute: 0.8
Monocytes Relative: 8
Monocytes Relative: 9
Neutro Abs: 4.4
Neutrophils Relative %: 60

## 2011-02-22 LAB — BASIC METABOLIC PANEL
BUN: 5 — ABNORMAL LOW
CO2: 28
Chloride: 105
Chloride: 106
Creatinine, Ser: 0.59
GFR calc Af Amer: >60
GFR calc non Af Amer: >60
Glucose, Bld: 110 — ABNORMAL HIGH
Potassium: 3.9
Potassium: 4
Sodium: 141

## 2011-02-22 LAB — COMPREHENSIVE METABOLIC PANEL
ALT: 9
AST: 18
Albumin: 3.7
Alkaline Phosphatase: 82
BUN: 5 — ABNORMAL LOW
Chloride: 103
Chloride: 103
Creatinine, Ser: 0.61
GFR calc Af Amer: >60
Glucose, Bld: 102 — ABNORMAL HIGH
Potassium: 4.2
Sodium: 140
Total Bilirubin: 0.4
Total Bilirubin: 0.4
Total Protein: 6.2
Total Protein: 7

## 2011-02-22 LAB — PROTEIN, BODY FLUID: Total protein, fluid: 4.4

## 2011-02-22 LAB — BODY FLUID CELL COUNT WITH DIFFERENTIAL
Neutrophil Count, Fluid: 37 — ABNORMAL HIGH
Total Nucleated Cell Count, Fluid: 9505 — ABNORMAL HIGH

## 2011-02-22 LAB — BODY FLUID CULTURE

## 2011-02-22 LAB — AFB CULTURE WITH SMEAR (NOT AT ARMC): Acid Fast Smear: NONE SEEN

## 2011-02-22 LAB — LACTATE DEHYDROGENASE, PLEURAL OR PERITONEAL FLUID

## 2011-02-22 LAB — TRIGLYCERIDES, BODY FLUIDS

## 2011-02-22 LAB — APTT: aPTT: 35

## 2011-02-22 LAB — HAEMOPHILIUS INFLUENZAE B AB IGG: Influenza B Virus Ab, IgG: 0.1 ug/mL

## 2011-02-22 LAB — CEA: CEA: <0.5

## 2011-02-22 LAB — PROTIME-INR: INR: 0.9

## 2011-02-22 LAB — CA 125: CA 125: 114.4 — ABNORMAL HIGH

## 2011-02-22 LAB — TSH: TSH: 1.658

## 2011-02-24 LAB — FUNGUS CULTURE W SMEAR: Fungal Smear: NONE SEEN

## 2011-02-24 LAB — CBC
HCT: 29.5 — ABNORMAL LOW
HCT: 37.8
HCT: 29 — ABNORMAL LOW
Hemoglobin: 10.3 — ABNORMAL LOW
Hemoglobin: 11.6 — ABNORMAL LOW
Hemoglobin: 9.5 — ABNORMAL LOW
Hemoglobin: 9.9 — ABNORMAL LOW
MCHC: 35.1
MCHC: 35.1
MCV: 95.3
MCV: 95.5
MCV: 95.9
Platelets: 264
RBC: 3.46 — ABNORMAL LOW
RBC: 2.88 — ABNORMAL LOW
RDW: 14.7
RDW: 14.4
RDW: 14.6
WBC: 10
WBC: 13.2 — ABNORMAL HIGH
WBC: 10.8 — ABNORMAL HIGH

## 2011-02-24 LAB — BASIC METABOLIC PANEL
CO2: 26
CO2: 30
Calcium: 8.8
Chloride: 102
Chloride: 98
Creatinine, Ser: 0.55
GFR calc Af Amer: >60
GFR calc Af Amer: >60
GFR calc non Af Amer: >60
GFR calc non Af Amer: >60
Glucose, Bld: 107 — ABNORMAL HIGH
Potassium: 3.9
Potassium: 3.5
Sodium: 135
Sodium: 132 — ABNORMAL LOW
Sodium: 134 — ABNORMAL LOW

## 2011-02-24 LAB — POCT I-STAT 3, ART BLOOD GAS (G3+)
Acid-Base Excess: 2
Bicarbonate: 26.7 — ABNORMAL HIGH
Operator id: 209401
TCO2: 28
pH, Arterial: 7.421 — ABNORMAL HIGH
pO2, Arterial: 64 — ABNORMAL LOW

## 2011-02-24 LAB — COMPREHENSIVE METABOLIC PANEL
AST: 21
Albumin: 3.8
Alkaline Phosphatase: 94
BUN: 5 — ABNORMAL LOW
BUN: 7
Calcium: 9.3
Chloride: 99
Creatinine, Ser: 0.64
Creatinine, Ser: 0.66
GFR calc Af Amer: >60
GFR calc non Af Amer: >60
Glucose, Bld: 119 — ABNORMAL HIGH
Potassium: 4
Total Bilirubin: 0.7
Total Bilirubin: 0.9
Total Protein: 7.6

## 2011-02-24 LAB — URINALYSIS, ROUTINE W REFLEX MICROSCOPIC
Bilirubin Urine: NEGATIVE
Leukocytes, UA: NEGATIVE
Nitrite: NEGATIVE
Specific Gravity, Urine: 1.025
Urobilinogen, UA: 0.2

## 2011-02-24 LAB — AFB CULTURE WITH SMEAR (NOT AT ARMC)

## 2011-02-24 LAB — TYPE AND SCREEN: ABO/RH(D): O POS

## 2011-02-24 LAB — URINE MICROSCOPIC-ADD ON

## 2011-02-24 LAB — PROTIME-INR: INR: 1

## 2011-02-24 LAB — POCT I-STAT 7, (LYTES, BLD GAS, ICA,H+H)
Bicarbonate: 29.4 — ABNORMAL HIGH
HCT: 37
Hemoglobin: 12.6
Operator id: 160261
Potassium: 3.7
Sodium: 141
pH, Arterial: 7.378

## 2011-02-24 LAB — APTT: aPTT: 37

## 2011-05-31 HISTORY — PX: COLONOSCOPY: SHX174

## 2012-01-24 ENCOUNTER — Other Ambulatory Visit: Payer: Self-pay | Admitting: Internal Medicine

## 2012-01-24 DIAGNOSIS — Z1231 Encounter for screening mammogram for malignant neoplasm of breast: Secondary | ICD-10-CM

## 2012-01-25 ENCOUNTER — Encounter: Payer: Self-pay | Admitting: Internal Medicine

## 2012-02-14 ENCOUNTER — Encounter: Payer: Self-pay | Admitting: Internal Medicine

## 2012-02-14 ENCOUNTER — Ambulatory Visit (AMBULATORY_SURGERY_CENTER): Payer: BC Managed Care – PPO

## 2012-02-14 VITALS — Ht 64.0 in | Wt 208.2 lb

## 2012-02-14 DIAGNOSIS — Z1211 Encounter for screening for malignant neoplasm of colon: Secondary | ICD-10-CM

## 2012-02-14 MED ORDER — MOVIPREP 100 G PO SOLR: ORAL | Status: DC

## 2012-02-29 ENCOUNTER — Encounter: Payer: Self-pay | Admitting: Internal Medicine

## 2012-03-05 ENCOUNTER — Ambulatory Visit
Admission: RE | Admit: 2012-03-05 | Discharge: 2012-03-05 | Disposition: A | Discharge status: Home / Self Care | Payer: BC Managed Care – PPO | Source: Ambulatory Visit | Attending: Internal Medicine | Admitting: Internal Medicine

## 2012-03-05 DIAGNOSIS — Z1231 Encounter for screening mammogram for malignant neoplasm of breast: Secondary | ICD-10-CM

## 2012-03-06 ENCOUNTER — Encounter: Payer: Self-pay | Admitting: Internal Medicine

## 2012-03-06 ENCOUNTER — Ambulatory Visit (AMBULATORY_SURGERY_CENTER): Payer: BC Managed Care – PPO | Admitting: Internal Medicine

## 2012-03-06 VITALS — BP 157/88 | HR 69 | Temp 96.1°F | Resp 23 | Ht 64.0 in | Wt 208.0 lb

## 2012-03-06 DIAGNOSIS — D126 Benign neoplasm of colon, unspecified: Secondary | ICD-10-CM

## 2012-03-06 DIAGNOSIS — Z1211 Encounter for screening for malignant neoplasm of colon: Secondary | ICD-10-CM

## 2012-03-06 MED ORDER — SODIUM CHLORIDE 0.9 % IV SOLN: 500.0000 mL | INTRAVENOUS | Status: DC

## 2012-03-06 NOTE — Patient Instructions (Addendum)
YOU HAD AN ENDOSCOPIC PROCEDURE TODAY AT THE [facility]: Refer to the procedure report that was given to you for any specific questions about what was found during the examination.  If the procedure report does not answer your questions, please call your gastroenterologist to clarify.  If you requested that your care partner not be given the details of your procedure findings, then the procedure report has been included in a sealed envelope for you to review at your convenience later.  YOU SHOULD EXPECT: Some feelings of bloating in the abdomen. Passage of more gas than usual.  Walking can help get rid of the air that was put into your GI tract during the procedure and reduce the bloating. If you had a lower endoscopy (such as a colonoscopy or flexible sigmoidoscopy) you may notice spotting of blood in your stool or on the toilet paper. If you underwent a bowel prep for your procedure, then you may not have a normal bowel movement for a few days.  DIET: Your first meal following the procedure should be a light meal and then it is ok to progress to your normal diet.  A half-sandwich or bowl of soup is an example of a good first meal.  Heavy or fried foods are harder to digest and may make you feel nauseous or bloated.  Likewise meals heavy in dairy and vegetables can cause extra gas to form and this can also increase the bloating.  Drink plenty of fluids but you should avoid alcoholic beverages for 24 hours.  ACTIVITY: Your care partner should take you home directly after the procedure.  You should plan to take it easy, moving slowly for the rest of the day.  You can resume normal activity the day after the procedure however you should NOT DRIVE or use heavy machinery for 24 hours (because of the sedation medicines used during the test).    SYMPTOMS TO REPORT IMMEDIATELY: A gastroenterologist can be reached at any hour.  During normal business hours, 8:30 AM to 5:00 PM Monday through Friday,  call [phone].  After hours and on weekends, please call the GI answering service at [phone] who will take a message and have the physician on call contact you.   Following lower endoscopy (colonoscopy or flexible sigmoidoscopy):  Excessive amounts of blood in the stool  Significant tenderness or worsening of abdominal pains  Swelling of the abdomen that is new, acute  Fever of 100F or higher  FOLLOW UP: If any biopsies were taken you will be contacted by phone or by letter within the next 1-3 weeks.  Call your gastroenterologist if you have not heard about the biopsies in 3 weeks.  Our staff will call the home number listed on your records the next business day following your procedure to check on you and address any questions or concerns that you may have at that time regarding the information given to you following your procedure. This is a courtesy call and so if there is no answer at the home number and we have not heard from you through the emergency physician on call, we will assume that you have returned to your regular daily activities without incident.  SIGNATURES/CONFIDENTIALITY: You and/or your care partner have signed paperwork which will be entered into your electronic medical record.  These signatures attest to the fact that that the information above on your After Visit Summary has been reviewed and is understood.  Full responsibility of the confidentiality of this   discharge information lies with you and/or your care-partner.   Thank-you for choosing us for your medical needs. 

## 2012-03-06 NOTE — Progress Notes (Addendum)
Patient did not have preoperative order for IV antibiotic SSI prophylaxis. (G8918)  Patient did not experience any of the following events: a burn prior to discharge; a fall within the facility; wrong site/side/patient/procedure/implant event; or a hospital transfer or hospital admission upon discharge from the facility. (G8907)  

## 2012-03-06 NOTE — Op Note (Signed)
[facility] [address] [place], [address]   COLONOSCOPY PROCEDURE REPORT  PATIENT: [name], [name]  MR#: [id number] BIRTHDATE: 07-11-1950 , 61  yrs. old GENDER: Female ENDOSCOPIST: Roxy Cedar, MD REFERRED XB:JYNWG Nicholos Johns, M.D. PROCEDURE DATE:  03/06/2012 PROCEDURE:   Colonoscopy with snare polypectomy    x 1 ASA CLASS:   Class II INDICATIONS:average risk screening. MEDICATIONS: MAC sedation, administered by CRNA and propofol (Diprivan) 380mg  IV  DESCRIPTION OF PROCEDURE:   After the risks benefits and alternatives of the procedure were thoroughly explained, informed consent was obtained.  A digital rectal exam revealed no abnormalities of the rectum.   The LB CF-[id number]  endoscope was introduced through the anus and advanced to the cecum, which was identified by both the appendix and ileocecal valve. No adverse events experienced.   The quality of the prep was excellent, using MoviPrep  The instrument was then slowly withdrawn as the colon was fully examined.      COLON FINDINGS: A sessile polyp measuring 5 mm in size was found in the sigmoid colon.  A polypectomy was performed with a cold snare. The resection was complete and the polyp tissue was completely retrieved.   Moderate diverticulosis was noted in the descending colon and sigmoid colon.   The colon mucosa was otherwise normal. Retroflexed views revealed no abnormalities. The time to cecum=7 minutes 32 seconds.  Withdrawal time=14 minutes 10 seconds.  The scope was withdrawn and the procedure completed. COMPLICATIONS: There were no complications.  ENDOSCOPIC IMPRESSION: 1.   Sessile polyp measuring 5 mm in size was found in the sigmoid colon; polypectomy was performed with a cold snare 2.   Moderate diverticulosis was noted in the descending colon and sigmoid colon 3.   The colon mucosa was otherwise normal  RECOMMENDATIONS: 1. Repeat colonoscopy in 5 years if polyp  adenomatous; otherwise 10 years   eSigned:  Roxy Cedar, MD 03/06/2012 2:47 PM   cc: Georgianne Fick, MD and The Patient   PATIENT NAME:  [name], [name] MR#: [id number]

## 2012-03-07 ENCOUNTER — Telehealth: Payer: Self-pay | Admitting: *Deleted

## 2012-03-07 NOTE — Telephone Encounter (Signed)
TELEPHONE NUMBER PROVIDED ON 03/06/12, UNABLE TO LEAVE A MESSAGE. ATTEMPTED TO CONTACT X2.

## 2012-03-12 ENCOUNTER — Encounter: Payer: Self-pay | Admitting: Internal Medicine

## 2012-07-26 ENCOUNTER — Other Ambulatory Visit: Payer: Self-pay | Admitting: Nurse Practitioner

## 2012-07-26 ENCOUNTER — Other Ambulatory Visit (HOSPITAL_COMMUNITY)
Admission: RE | Admit: 2012-07-26 | Discharge: 2012-07-26 | Disposition: A | Discharge status: Home / Self Care | Payer: BC Managed Care – PPO | Source: Ambulatory Visit | Attending: Obstetrics and Gynecology | Admitting: Obstetrics and Gynecology

## 2012-07-26 DIAGNOSIS — Z01419 Encounter for gynecological examination (general) (routine) without abnormal findings: Secondary | ICD-10-CM | POA: Insufficient documentation

## 2012-07-26 DIAGNOSIS — Z1151 Encounter for screening for human papillomavirus (HPV): Secondary | ICD-10-CM | POA: Insufficient documentation

## 2012-09-07 LAB — CBC
HGB: 14.9 g/dL
MCV: 102.1 fL
WBC: 7.4
platelet count: 208

## 2012-09-17 ENCOUNTER — Encounter (INDEPENDENT_AMBULATORY_CARE_PROVIDER_SITE_OTHER): Payer: Self-pay

## 2012-09-25 ENCOUNTER — Encounter (INDEPENDENT_AMBULATORY_CARE_PROVIDER_SITE_OTHER): Payer: Self-pay | Admitting: General Surgery

## 2012-09-25 ENCOUNTER — Ambulatory Visit (INDEPENDENT_AMBULATORY_CARE_PROVIDER_SITE_OTHER): Payer: BC Managed Care – PPO | Admitting: General Surgery

## 2012-09-25 VITALS — BP 142/66 | HR 84 | Temp 97.1°F | Resp 20 | Ht 63.5 in | Wt 208.4 lb

## 2012-09-25 DIAGNOSIS — R079 Chest pain, unspecified: Secondary | ICD-10-CM | POA: Insufficient documentation

## 2012-09-25 NOTE — Patient Instructions (Signed)
Will call with results of CT 

## 2012-09-28 ENCOUNTER — Ambulatory Visit
Admission: RE | Admit: 2012-09-28 | Discharge: 2012-09-28 | Disposition: A | Discharge status: Home / Self Care | Payer: BC Managed Care – PPO | Source: Ambulatory Visit | Attending: General Surgery | Admitting: General Surgery

## 2012-09-28 DIAGNOSIS — R079 Chest pain, unspecified: Secondary | ICD-10-CM

## 2012-09-28 MED ORDER — IOHEXOL 300 MG/ML  SOLN
125.0000 mL | Freq: Once | INTRAMUSCULAR | Status: AC | PRN
  Administered 2012-09-28: 125 mL via INTRAVENOUS

## 2012-10-02 ENCOUNTER — Telehealth (INDEPENDENT_AMBULATORY_CARE_PROVIDER_SITE_OTHER): Payer: Self-pay | Admitting: *Deleted

## 2012-10-02 NOTE — Telephone Encounter (Signed)
Patient called to request her CT results.  Explained to patient that CT showed no acute findings to show cause for her pain.  Patient asked what the plan forward was since she is having pain.

## 2012-10-03 ENCOUNTER — Telehealth (INDEPENDENT_AMBULATORY_CARE_PROVIDER_SITE_OTHER): Payer: Self-pay

## 2012-10-03 NOTE — Telephone Encounter (Signed)
Patient returning call.  Pt informed of negative CT results.  Instructed to follow up in 3 months for pain or if needing to see gi MD.  Patient verbalized understanding and agrees with plan.

## 2012-10-03 NOTE — Telephone Encounter (Signed)
LMOM to call back. Ct negative. She can follow up with Dr Carolynne Edouard in 3 months to check pain or she needs to get back in with gi doctor.

## 2012-10-03 NOTE — Telephone Encounter (Signed)
Message copied by Brennan Bailey on Wed Oct 03, 2012 12:19 PM ------      Message from: Caleen Essex III      Created: Wed Oct 03, 2012 10:15 AM       No evidence of hernia. No obvious source of pain ------

## 2012-10-03 NOTE — Progress Notes (Signed)
Subjective:     Patient ID: [name], female   DOB: Dec 23, 1950, 62 y.o.   MRN: [id number]  HPI We are asked to see the patient in consultation by Dr. Nicholos Johns to evaluate her for abdominal pain. The patient is a 62 year old female who started feeling bad during the fall of 2013. Her pain has progressed to being fairly constant. She mostly notes her pain in her left upper quadrant. She denies any nausea or vomiting. She denies any fevers or chills. She denies any chest pain or shortness of breath. Her bowels move pretty regularly.  Review of Systems  Constitutional: Negative.   HENT: Negative.   Eyes: Negative.   Respiratory: Negative.   Cardiovascular: Negative.   Gastrointestinal: Positive for abdominal pain.  Endocrine: Negative.   Genitourinary: Negative.   Musculoskeletal: Negative.   Skin: Negative.   Allergic/Immunologic: Negative.   Neurological: Negative.   Hematological: Negative.   Psychiatric/Behavioral: Negative.        Objective:   Physical Exam  Constitutional: She is oriented to person, place, and time. She appears well-developed and well-nourished.  HENT:  Head: Normocephalic and atraumatic.  Eyes: Conjunctivae and EOM are normal. Pupils are equal, round, and reactive to light.  Neck: Normal range of motion. Neck supple.  Cardiovascular: Normal rate, regular rhythm and normal heart sounds.   Pulmonary/Chest: Effort normal and breath sounds normal.  Abdominal: Soft. Bowel sounds are normal.  There is tenderness in the left upper quadrant but this seems mostly tender on her rib cage. Her abdomen seems soft and nontender. There is no palpable mass.  Musculoskeletal: Normal range of motion.  Neurological: She is alert and oriented to person, place, and time.  Skin: Skin is warm and dry.  Psychiatric: She has a normal mood and affect. Her behavior is normal.       Assessment:     The patient has left upper quadrant pain that seems more located on her rib  cage down in her abdominal cavity. I do not palpate any evidence of a hernia. Because the source of her pain is not clear I would recommend getting a CT scan of her abdomen and pelvis to further evaluate this. It is certainly possible she may have some costochondritis from her continued smoking and coughing.     Plan:     Plan for CT of the abdomen and pelvis and we will call her with the results.

## 2012-10-04 ENCOUNTER — Telehealth: Payer: Self-pay | Admitting: Internal Medicine

## 2012-10-04 ENCOUNTER — Ambulatory Visit (INDEPENDENT_AMBULATORY_CARE_PROVIDER_SITE_OTHER): Payer: BC Managed Care – PPO | Admitting: Nurse Practitioner

## 2012-10-04 ENCOUNTER — Encounter: Payer: Self-pay | Admitting: Nurse Practitioner

## 2012-10-04 VITALS — BP 90/66 | HR 100 | Ht 63.39 in | Wt 211.0 lb

## 2012-10-04 DIAGNOSIS — IMO0001 Reserved for inherently not codable concepts without codable children: Secondary | ICD-10-CM

## 2012-10-04 DIAGNOSIS — M7918 Myalgia, other site: Secondary | ICD-10-CM

## 2012-10-04 MED ORDER — METHYLPREDNISOLONE 4 MG PO KIT: PACK | ORAL | Status: DC

## 2012-10-04 NOTE — Telephone Encounter (Signed)
Left message for pt to call back  °

## 2012-10-04 NOTE — Progress Notes (Signed)
History of Present Illness:  Patient is a 62 year old female who had a screening colonoscopy with Dr. Marina Goodell October of last year. Findings included moderate diverticulosis and a sessile polyp which was removed from the sigmoid colon and compatible with a hyperplastic polyp.  Patient is worked in today for abdominal pain. PCP thought pain related to a hernia in LUQ and referred her for surgical evaluation. Patient was seen by Dr. Carolynne Edouard 09/25/12 who felt the pain was likely musculoskeletal but obained a CTscan with contrast for completeness. The scan revealed a small hemangioma in the liver but was otherwise unremarkable. Patinet saw Dr. Dareen Piano (her Rheumatologist) for the pain, he does not feel it is related to her Wegener's  Pain is not related to eating. No associated nausea. Pain is often associated with movement but sometimes occurs just when sitting still. It does hurt to cough / take deep breaths.   Current Medications, Allergies, Past Medical History, Past Surgical History, Family History and Social History were reviewed in [employer] record.  Studies:   Ct Abdomen Pelvis W Contrast  09/28/2012  *RADIOLOGY REPORT*  Clinical Data: Lower left chest wall pain and upper abdominal pain.  CT ABDOMEN AND PELVIS WITH CONTRAST  Technique:  Multidetector CT imaging of the abdomen and pelvis was performed following the standard protocol during bolus administration of intravenous contrast.  Contrast: OMNIPAQUE IOHEXOL 300 MG/ML  SOLN  Comparison: CT of the abdomen and pelvis 11/10/2010.  Findings:  Lung Bases: Postoperative changes of wedge resection are again noted in the right lower lobe and right middle lobe with some surrounding architectural distortion and scarring which is chronic.  Abdomen/Pelvis:  Again noted is a small hypervascular lesion with nodular peripheral enhancement in segment 3 of the liver measuring approximately 1.6 cm in diameter, similar to the prior study,  most compatible with a small cavernous hemangioma.  The remainder of the liver is otherwise unremarkable in appearance.  Status post cholecystectomy.  The appearance of the pancreas, spleen, bilateral adrenal glands and the right kidney is unremarkable.  Subcentimeter low attenuation lesion in the lower pole of the left kidney is too small to definitively characterize, but is unchanged compared to the prior study from 2012 and is therefore favored to represent a small cyst.  No significant volume of ascites.  No pneumoperitoneum.  No pathologic distension of small bowel.  No definite pathologic lymphadenopathy identified within the abdomen or pelvis. Atherosclerosis throughout the abdominal and pelvic vasculature, without definite aneurysm or dissection.  Uterus and ovaries are unremarkable in appearance.  Urinary bladder is normal in appearance.  Musculoskeletal: There are no aggressive appearing lytic or blastic lesions noted in the visualized portions of the skeleton.  IMPRESSION: 1.  No acute findings in the abdomen or pelvis to account the patient's history of pain. 2.  Status post cholecystectomy. 3.  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   Original Report Authenticated By: Trudie Reed, M.D.    Physical Exam: General: Well developed , white female in no acute distress Head: Normocephalic and atraumatic Eyes:  sclerae anicteric, conjunctiva pink  Ears: Normal auditory acuity Lungs: Clear throughout to auscultation Heart: Regular rate and rhythm Abdomen: Soft, non distended, non-tender. No masses, no hepatomegaly. Normal bowel sounds Musculoskeletal: Symmetrical with no gross deformities . Localized left anterior rib cage tenderness Extremities: No edema  Neurological: Alert oriented x 4, grossly nonfocal Psychological:  Alert and cooperative. Normal mood and affect  Assessment and Recommendations:  1.  several month history of left upper quadrant pain. Her history and  exam are more compatible with musculoskeletal rather than gastrointestinal pain. Abdominal wall pain from nerve entrapment could be possible as well This pain has been evaluated by PCP as well as a Development worker, international aid. She has also discussed this pain with her rheumatologist, Dr. Dareen Piano. She has definite rib cage tenderness. Will try a Medrol Dosepak and ibuprofen for one week. I gave her samples of a PPI for mucosal protection of her gastrointestinal tract while taking his medications. Patient will call me in 7-10 days with a condition update.  2. Wegener's, diagnosed 2009 by right lung biopsy. Followed by Dr. Dareen Piano

## 2012-10-04 NOTE — Telephone Encounter (Signed)
Patient is having pain in stomach area.  Her doctor feels that it might be GI related.  She is wanting to be seen today.

## 2012-10-04 NOTE — Patient Instructions (Addendum)
You have been given a separate informational sheet regarding your tobacco use, the importance of quitting and local resources to help you quit. Take 1 Motrin 400 mg 3 times a day for 7 days.  Take 1 Nexium in the AM before breakfast, samples given.  We will send a prescription for Medrol dosepack. Instructions given today at this office visit.  Call Rene Kocher or Pam with a progress report after you finish the medication.

## 2012-10-05 ENCOUNTER — Encounter: Payer: Self-pay | Admitting: Nurse Practitioner

## 2012-10-05 NOTE — Progress Notes (Signed)
Agree with initial assessment and plans. If musculoskeletal pain does not improve with this therapy, she needs to return to her PCP for further assessment/treatment

## 2012-10-09 NOTE — Telephone Encounter (Signed)
Pt was actually seen by Willette Cluster NP on 10/04/12. Pt states she is doing better and she plans to call us back on 10/15/12 with an update on her progress.

## 2012-10-15 ENCOUNTER — Telehealth: Payer: Self-pay | Admitting: *Deleted

## 2012-10-15 NOTE — Telephone Encounter (Signed)
[name] called to tell us that she is having no more of the stabbing pain. She has some of the pain but nothing like before. She has no stabbing pain now.  I told her to take her motrin  As needed and we will fax Paula's note to Dr Azzie Roup., Reumatologist.

## 2013-02-11 ENCOUNTER — Ambulatory Visit (INDEPENDENT_AMBULATORY_CARE_PROVIDER_SITE_OTHER): Payer: BC Managed Care – PPO | Admitting: Family Medicine

## 2013-02-11 ENCOUNTER — Encounter: Payer: Self-pay | Admitting: Family Medicine

## 2013-02-11 VITALS — BP 164/92 | HR 81 | Temp 98.2°F | Ht 63.5 in | Wt 210.8 lb

## 2013-02-11 DIAGNOSIS — Z23 Encounter for immunization: Secondary | ICD-10-CM

## 2013-02-11 DIAGNOSIS — Z0000 Encounter for general adult medical examination without abnormal findings: Secondary | ICD-10-CM | POA: Insufficient documentation

## 2013-02-11 DIAGNOSIS — E669 Obesity, unspecified: Secondary | ICD-10-CM

## 2013-02-11 DIAGNOSIS — R030 Elevated blood-pressure reading, without diagnosis of hypertension: Secondary | ICD-10-CM

## 2013-02-11 DIAGNOSIS — E039 Hypothyroidism, unspecified: Secondary | ICD-10-CM

## 2013-02-11 DIAGNOSIS — K5790 Diverticulosis of intestine, part unspecified, without perforation or abscess without bleeding: Secondary | ICD-10-CM

## 2013-02-11 DIAGNOSIS — K5730 Diverticulosis of large intestine without perforation or abscess without bleeding: Secondary | ICD-10-CM

## 2013-02-11 DIAGNOSIS — M3130 Wegener's granulomatosis without renal involvement: Secondary | ICD-10-CM

## 2013-02-11 DIAGNOSIS — F17200 Nicotine dependence, unspecified, uncomplicated: Secondary | ICD-10-CM

## 2013-02-11 DIAGNOSIS — F17210 Nicotine dependence, cigarettes, uncomplicated: Secondary | ICD-10-CM | POA: Insufficient documentation

## 2013-02-11 LAB — CBC WITH DIFFERENTIAL/PLATELET
Eosinophils Relative: 4.5 % (ref 0.0–5.0)
HCT: 44.1 % (ref 36.0–46.0)
Lymphocytes Relative: 27.9 % (ref 12.0–46.0)
Lymphs Abs: 1.7 10*3/uL (ref 0.7–4.0)
Monocytes Relative: 6.4 % (ref 3.0–12.0)
Neutrophils Relative %: 60.4 % (ref 43.0–77.0)
Platelets: 196 10*3/uL (ref 150.0–400.0)
WBC: 6.1 10*3/uL (ref 4.5–10.5)

## 2013-02-11 LAB — BASIC METABOLIC PANEL
BUN: 11 mg/dL (ref 6–23)
Calcium: 8.8 mg/dL (ref 8.4–10.5)
GFR: 75.27 mL/min (ref >60.00)
Glucose, Bld: 105 mg/dL — ABNORMAL HIGH (ref 70–99)
Potassium: 4.5 mEq/L (ref 3.5–5.1)

## 2013-02-11 LAB — LIPID PANEL
Total CHOL/HDL Ratio: 5
Triglycerides: 183 mg/dL — ABNORMAL HIGH (ref 0.0–149.0)

## 2013-02-11 LAB — LDL CHOLESTEROL, DIRECT: Direct LDL: 183 mg/dL

## 2013-02-11 NOTE — Assessment & Plan Note (Signed)
In setting of weight gain - will check TSH and free T4 to eval for correct dose of thyroid supplement.

## 2013-02-11 NOTE — Progress Notes (Signed)
Subjective:    Patient ID: [name], female    DOB: November 24, 1950, 62 y.o.   MRN: [id number]  HPI CC: new pt to establish  Pleasant 62 yo presents today to establish care at our office.  Prior saw Dr. Nicholos Johns.  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  Now in remission, sees rheum once a year Dareen Piano in [facility]).  Treated with prednisone and cyclosporin.  Presented with dypsnea, fatigue and persistent cold with cough.  Last saw Dr. Dareen Piano 08/2012.  Hypothyroidism - takes synthroid daily.  Dx age 34s.  Last tsh was 02/2012.  Weight gain noted over last several months, pt upset about this.  Elevated bp today - denies h/o HTN.  H/o microhematuria - s/p eval with CT and cysto per uro - states normal w/u.  Smoker - 1-1.5 ppd.  60+ PY hx.  Started at age 40yo.  Obesity - 20 lbs in last year.   Body mass index is 36.74 kg/(m^2).  Wt Readings from Last 3 Encounters:  02/11/13 210 lb 12 oz (95.596 kg)  10/04/12 211 lb (95.709 kg)  09/25/12 208 lb 6.4 oz (94.53 kg)    GI - Marina Goodell Rheum Dareen Piano GYN - Margo Aye [place])  Preventative: Due for CPE October Colonoscopy 2013 Marina Goodell) 1 polyp, diverticulosis. mammo 2013 normal ([facility]) Pap smear 2013 Well woman with OBGYN Flu shot today Tetanus shot - unsure, ?2008 Pneumovax - 2012 Shingles shot - will check with Dareen Piano.  Husband passed away 03/06/2012 from colon cancer. Son disabled, lives with pt. Grandsons (10 yo twin) at home frequently Occ: Investment banker, corporate  Edu: HS Activity: walks at her property, with inspections Diet: some water, fruits/vegetables daily  Medications and allergies reviewed and updated in chart.  Past histories reviewed and updated if relevant as below. Patient Active Problem List   Diagnosis Date Noted  . Hypothyroidism   . Smoker   . Obesity   . Diverticulosis   . Musculoskeletal pain 10/04/2012  . Chest pain, unspecified 09/25/2012  . WEGENERS GRANULOMATOSIS 11/14/2007   . TOBACCO ABUSE 09/27/2007  . PLEURAL EFFUSION, RIGHT 09/27/2007  . PULMONARY NODULE 09/27/2007   Past Medical History  Diagnosis Date  . Wegener's granulomatosis 2009    in remission ([facility]um [facility] at [facility])  . Hypothyroidism 1980s  . Smoker 1970  . Obesity   . Diverticulosis     by colonoscopy   Past Surgical History  Procedure Laterality Date  . Appendectomy  1980s  . Cholecystectomy  1980s  . Lung surgery Right 2009    removal of scar tissue; biopsy  . Tonsillectomy  child  . Colonoscopy  2013    hyperplastic polyp, diverticulosis rpt 10 yrs Marina Goodell)   History  Substance Use Topics  . Smoking status: Current Every Day Smoker -- 1.00 packs/day for 40 years    Types: Cigarettes    Start date: 05/30/1968  . Smokeless tobacco: Never Used  . Alcohol Use: 25.2 oz/week    42 Cans of beer per week     Comment: regular (beer)   Family History  Problem Relation Age of Onset  . Adopted: Yes  . Cancer Mother 72    breast  . CAD Father     MI   Allergies  Allergen Reactions  . Codeine Other (See Comments)    Chest pain   Current Outpatient Prescriptions on File Prior to Visit  Medication Sig Dispense Refill  . levothyroxine (SYNTHROID, LEVOTHROID) 112 MCG tablet Take 112 mcg by  mouth daily.       No current facility-administered medications on file prior to visit.     Review of Systems  Constitutional: Positive for unexpected weight change (weight gain over last few years). Negative for fever, chills, activity change, appetite change and fatigue.  HENT: Negative for hearing loss and neck pain.   Eyes: Negative for visual disturbance.  Respiratory: Positive for cough (cigarette cough). Negative for chest tightness, shortness of breath and wheezing.   Cardiovascular: Positive for palpitations (rapid heart beats). Negative for chest pain and leg swelling.  Gastrointestinal: Negative for nausea, vomiting, abdominal pain, diarrhea, constipation, blood in stool and  abdominal distention.  Genitourinary: Negative for hematuria and difficulty urinating.  Musculoskeletal: Negative for myalgias and arthralgias.  Skin: Negative for rash.  Neurological: Negative for dizziness, seizures, syncope and headaches.  Hematological: Negative for adenopathy. Does not bruise/bleed easily.  Psychiatric/Behavioral: Negative for dysphoric mood. The patient is not nervous/anxious.        Objective:   Physical Exam  Nursing note and vitals reviewed. Constitutional: She is oriented to person, place, and time. She appears well-developed and well-nourished. No distress.  HENT:  Head: Normocephalic and atraumatic.  Right Ear: External ear normal.  Left Ear: External ear normal.  Nose: Nose normal.  Mouth/Throat: Oropharynx is clear and moist. No oropharyngeal exudate.  Eyes: Conjunctivae and EOM are normal. Pupils are equal, round, and reactive to light. No scleral icterus.  Neck: Normal range of motion. Neck supple. Carotid bruit is not present. No thyromegaly present.  Cardiovascular: Normal rate, regular rhythm, normal heart sounds and intact distal pulses.   No murmur heard. Pulses:      Radial pulses are 2+ on the right side, and 2+ on the left side.  Pulmonary/Chest: Effort normal and breath sounds normal. No respiratory distress. She has no wheezes. She has no rales.  Abdominal: Soft. Bowel sounds are normal. She exhibits no distension and no mass. There is no tenderness. There is no rebound and no guarding.  Musculoskeletal: Normal range of motion. She exhibits no edema.  Lymphadenopathy:    She has no cervical adenopathy.  Neurological: She is alert and oriented to person, place, and time.  CN grossly intact, station and gait intact  Skin: Skin is warm and dry. No rash noted.  Psychiatric: She has a normal mood and affect. Her behavior is normal. Judgment and thought content normal.      Assessment & Plan:

## 2013-02-11 NOTE — Assessment & Plan Note (Addendum)
Preventative protocols reviewed and updated unless pt declined. Discussed healthy diet and lifestyle. Pt has OBGYN for well woman exam. UTD colonoscopy Will check with rheum and insurance about shingles shot.

## 2013-02-11 NOTE — Assessment & Plan Note (Signed)
Stable, followed regularly by rheum (Dr. Dareen Piano at [facility])

## 2013-02-11 NOTE — Addendum Note (Signed)
Addended by: Josph Macho A on: 02/11/2013 11:10 AM   Modules accepted: Orders

## 2013-02-11 NOTE — Assessment & Plan Note (Signed)
Discussed increased activity.  Discussed increased water. Body mass index is 36.74 kg/(m^2).

## 2013-02-11 NOTE — Assessment & Plan Note (Signed)
Encouraged smoking cessation - precontemplative. 

## 2013-02-11 NOTE — Patient Instructions (Addendum)
Let's keep an eye on blood pressure - let me know if consistently >140/90 Flu shot today. Sign release form for records from prior PCP and rheumatologist. Good to meet you today, call us with questions. Increase water in diet.

## 2013-02-13 ENCOUNTER — Encounter: Payer: Self-pay | Admitting: *Deleted

## 2013-02-19 ENCOUNTER — Telehealth: Payer: Self-pay

## 2013-02-19 NOTE — Telephone Encounter (Signed)
[name] trying to reach pt about Dr Ewell Poe contact information. Pt called contact info to office; [name] already has information. Pt will call 1 week prior to needing refill of thyroid med for refill.

## 2013-02-25 ENCOUNTER — Encounter: Payer: Self-pay | Admitting: Family Medicine

## 2013-03-06 ENCOUNTER — Encounter: Payer: Self-pay | Admitting: *Deleted

## 2013-04-17 ENCOUNTER — Other Ambulatory Visit: Payer: Self-pay

## 2013-04-17 MED ORDER — LEVOTHYROXINE SODIUM 112 MCG PO TABS: 112.0000 ug | ORAL_TABLET | Freq: Every day | ORAL | Status: DC

## 2013-04-17 NOTE — Telephone Encounter (Signed)
plz notify med sent into [facility].

## 2013-04-17 NOTE — Telephone Encounter (Signed)
Pt left v/m; pt saw Dr Reece Agar to establish care 01/2013 and pt needs refill on thyroid med to [facility] [place]; Dr Reece Agar has never prescribed before.Please advise.

## 2013-04-18 NOTE — Telephone Encounter (Signed)
Message left notifying patient.

## 2013-08-14 ENCOUNTER — Ambulatory Visit: Payer: BC Managed Care – PPO | Admitting: Family Medicine

## 2013-09-11 ENCOUNTER — Other Ambulatory Visit: Payer: Self-pay | Admitting: Rheumatology

## 2013-09-11 DIAGNOSIS — R0600 Dyspnea, unspecified: Secondary | ICD-10-CM

## 2013-09-13 ENCOUNTER — Other Ambulatory Visit: Payer: BC Managed Care – PPO

## 2013-09-16 ENCOUNTER — Other Ambulatory Visit: Payer: BC Managed Care – PPO

## 2013-09-16 ENCOUNTER — Ambulatory Visit
Admission: RE | Admit: 2013-09-16 | Discharge: 2013-09-16 | Disposition: A | Discharge status: Home / Self Care | Payer: BC Managed Care – PPO | Source: Ambulatory Visit | Attending: Rheumatology | Admitting: Rheumatology

## 2013-09-16 ENCOUNTER — Other Ambulatory Visit: Payer: Self-pay | Admitting: Rheumatology

## 2013-09-16 DIAGNOSIS — R0600 Dyspnea, unspecified: Secondary | ICD-10-CM

## 2013-09-16 MED ORDER — IOHEXOL 350 MG/ML SOLN
100.0000 mL | Freq: Once | INTRAVENOUS | Status: AC | PRN
  Administered 2013-09-16: 100 mL via INTRAVENOUS

## 2013-09-16 MED ORDER — IOHEXOL 350 MG/ML SOLN
125.0000 mL | Freq: Once | INTRAVENOUS | Status: DC | PRN
Start: 2013-09-16 — End: 2013-09-17

## 2013-10-02 ENCOUNTER — Other Ambulatory Visit (HOSPITAL_COMMUNITY): Payer: Self-pay | Admitting: Rheumatology

## 2013-10-02 DIAGNOSIS — R918 Other nonspecific abnormal finding of lung field: Secondary | ICD-10-CM

## 2013-10-04 ENCOUNTER — Other Ambulatory Visit: Payer: Self-pay | Admitting: Radiology

## 2013-10-04 ENCOUNTER — Encounter (HOSPITAL_COMMUNITY): Payer: Self-pay | Admitting: Pharmacy Technician

## 2013-10-04 ENCOUNTER — Other Ambulatory Visit (HOSPITAL_COMMUNITY): Payer: Self-pay | Admitting: Rheumatology

## 2013-10-04 DIAGNOSIS — R222 Localized swelling, mass and lump, trunk: Secondary | ICD-10-CM

## 2013-10-09 ENCOUNTER — Ambulatory Visit (HOSPITAL_COMMUNITY): Admission: RE | Admit: 2013-10-09 | Payer: BC Managed Care – PPO | Source: Ambulatory Visit

## 2013-10-10 ENCOUNTER — Encounter (HOSPITAL_COMMUNITY): Payer: Self-pay

## 2013-10-10 ENCOUNTER — Other Ambulatory Visit: Payer: Self-pay | Admitting: Radiology

## 2013-10-10 ENCOUNTER — Encounter (HOSPITAL_COMMUNITY)
Admission: RE | Admit: 2013-10-10 | Discharge: 2013-10-10 | Disposition: A | Discharge status: Home / Self Care | Payer: BC Managed Care – PPO | Source: Ambulatory Visit | Attending: Rheumatology | Admitting: Rheumatology

## 2013-10-10 DIAGNOSIS — R222 Localized swelling, mass and lump, trunk: Secondary | ICD-10-CM

## 2013-10-10 LAB — GLUCOSE, CAPILLARY: GLUCOSE-CAPILLARY: 138 mg/dL — AB (ref 70–99)

## 2013-10-10 MED ORDER — FLUDEOXYGLUCOSE F - 18 (FDG) INJECTION
11.2000 | Freq: Once | INTRAVENOUS | Status: AC | PRN
  Administered 2013-10-10: 11.2 via INTRAVENOUS

## 2013-10-15 ENCOUNTER — Ambulatory Visit (HOSPITAL_COMMUNITY): Admission: RE | Admit: 2013-10-15 | Payer: BC Managed Care – PPO | Source: Ambulatory Visit

## 2013-10-16 ENCOUNTER — Other Ambulatory Visit: Payer: Self-pay | Admitting: Radiology

## 2013-10-17 ENCOUNTER — Ambulatory Visit (HOSPITAL_COMMUNITY)
Admission: RE | Admit: 2013-10-17 | Discharge: 2013-10-17 | Disposition: A | Discharge status: Home / Self Care | Payer: BC Managed Care – PPO | Source: Ambulatory Visit | Attending: Interventional Radiology | Admitting: Interventional Radiology

## 2013-10-17 ENCOUNTER — Encounter (HOSPITAL_COMMUNITY): Payer: Self-pay

## 2013-10-17 ENCOUNTER — Ambulatory Visit (HOSPITAL_COMMUNITY)
Admission: RE | Admit: 2013-10-17 | Discharge: 2013-10-17 | Disposition: A | Discharge status: Home / Self Care | Payer: BC Managed Care – PPO | Source: Ambulatory Visit | Attending: Rheumatology | Admitting: Rheumatology

## 2013-10-17 VITALS — BP 144/83 | HR 78 | Temp 98.0°F | Resp 20 | Ht 64.5 in | Wt 200.0 lb

## 2013-10-17 DIAGNOSIS — R918 Other nonspecific abnormal finding of lung field: Secondary | ICD-10-CM

## 2013-10-17 DIAGNOSIS — M3130 Wegener's granulomatosis without renal involvement: Secondary | ICD-10-CM | POA: Insufficient documentation

## 2013-10-17 DIAGNOSIS — R911 Solitary pulmonary nodule: Secondary | ICD-10-CM | POA: Insufficient documentation

## 2013-10-17 LAB — APTT: APTT: 29 s (ref 24–37)

## 2013-10-17 LAB — CBC
HEMATOCRIT: 43.3 % (ref 36.0–46.0)
Hemoglobin: 14.3 g/dL (ref 12.0–15.0)
MCH: 34.6 pg — AB (ref 26.0–34.0)
MCHC: 33 g/dL (ref 30.0–36.0)
MCV: 104.8 fL — ABNORMAL HIGH (ref 78.0–100.0)
Platelets: 234 10*3/uL (ref 150–400)
RBC: 4.13 MIL/uL (ref 3.87–5.11)
RDW: 14.7 % (ref 11.5–15.5)
WBC: 6.9 10*3/uL (ref 4.0–10.5)

## 2013-10-17 LAB — PROTIME-INR
INR: 0.84 (ref 0.00–1.49)
Prothrombin Time: 11.4 seconds — ABNORMAL LOW (ref 11.6–15.2)

## 2013-10-17 MED ORDER — MIDAZOLAM HCL 2 MG/2ML IJ SOLN
INTRAMUSCULAR | Status: AC | PRN
  Administered 2013-10-17: 1 mg via INTRAVENOUS
  Administered 2013-10-17: 2 mg via INTRAVENOUS

## 2013-10-17 MED ORDER — FENTANYL CITRATE 0.05 MG/ML IJ SOLN
INTRAMUSCULAR | Status: AC
  Filled 2013-10-17: qty 4

## 2013-10-17 MED ORDER — MIDAZOLAM HCL 2 MG/2ML IJ SOLN
INTRAMUSCULAR | Status: AC
  Filled 2013-10-17: qty 4

## 2013-10-17 MED ORDER — FENTANYL CITRATE 0.05 MG/ML IJ SOLN
INTRAMUSCULAR | Status: AC | PRN
  Administered 2013-10-17: 25 ug via INTRAVENOUS
  Administered 2013-10-17 (×2): 50 ug via INTRAVENOUS

## 2013-10-17 MED ORDER — SODIUM CHLORIDE 0.9 % IV SOLN
Freq: Once | INTRAVENOUS | Status: AC
  Administered 2013-10-17: 08:00:00 via INTRAVENOUS

## 2013-10-17 NOTE — Procedures (Signed)
LLL lung nodule Bx No comp 

## 2013-10-17 NOTE — Discharge Instructions (Signed)
Needle Biopsy of Lung, Care After °Refer to this sheet in the next few weeks. These instructions provide you with information on caring for yourself after your procedure. Your health care provider may also give you more specific instructions. Your treatment has been planned according to current medical practices, but problems sometimes occur. Call your health care provider if you have any problems or questions after your procedure. °WHAT TO EXPECT AFTER THE PROCEDURE °A bandage will be applied over the areas where the needle was inserted. You may be asked to apply pressure to the bandage for several minutes to ensure there is minimal bleeding. In most cases, you can leave when your needle biopsy procedure is completed. Do not drive yourself home. Someone else should take you home. If you received an IV sedative or general anesthetic, you will be taken to a comfortable place to relax while the medication wears off. If you have upcoming travel scheduled, talk to your doctor about when it is safe to travel by air after the procedure. °HOME CARE INSTRUCTIONS °Expect to take it easy for the rest of the day. Protect the area where you received the needle biopsy by keeping the bandage in place for as long as instructed. You may feel some mild pain or discomfort in the area, but this should stop in a day or two. Only take over-the-counter or prescription medicines for pain, discomfort, or fever as directed by your caregiver. °SEEK MEDICAL CARE IF:  °· You have pain at the biopsy site that worsens or is not helped by medication. °· You have swelling or drainage at the needle biopsy site. °· You have a fever. °SEEK IMMEDIATE MEDICAL CARE IF:  °· You have new or worsening shortness of breath. °· You have chest pain. °· You are coughing up blood. °· You have bleeding that does not stop with pressure or a bandage. °· You develop light-headedness or fainting. °Document Released: 03/13/2007 Document Revised: 01/16/2013 Document  Reviewed: 10/08/2012 °ExitCare® Patient Information ©2014 [employer]. ° °

## 2013-10-17 NOTE — H&P (Signed)
Chief Complaint: "Overall weakness, generalized pain and shortness of breath." Referring Physician: Ouida Sills HPI: [name] is an 63 y.o. female with history of wegener's granulomatosis 2009 and has been in remission. She has been c/o increased and persistent chest pain, overall general pain and fatigue. She complains of shortness of breath. The patient underwent a CT and PET with findings including a left lower lobe lung nodule, patient has been on prednisone and size of nodule has slightly decreased. The patient still continues with severe symptoms. IR received request for image guided biopsy. She denies any localized chest pain today, she complains of chronic shortness of breath. She denies any active signs of bleeding or excessive bruising. She denies any recent fever or chills. The patient denies any history of sleep apnea or chronic oxygen use. She has previously tolerated sedation without complications during a colonoscopy.    Past Medical History:  Past Medical History  Diagnosis Date  . Wegener's granulomatosis 2009    in remission (Rheum Anderson at [facility])  . Hypothyroidism 1980s  . Smoker 1970  . Obesity   . Diverticulosis     by colonoscopy  . Microhematuria 2000s    s/p normal w/u per patient    Past Surgical History:  Past Surgical History  Procedure Laterality Date  . Appendectomy  1980s  . Cholecystectomy  1980s  . Video assisted thoracoscopy (vats)/thorocotomy Right 2009    removal of scar tissue; nodule biopsy x3  . Tonsillectomy  child  . Colonoscopy  2013    hyperplastic polyp, diverticulosis rpt 10 yrs Henrene Pastor)    Family History:  Family History  Problem Relation Age of Onset  . Adopted: Yes  . Cancer Mother 92    breast  . CAD Father     MI    Social History:  reports that she has been smoking Cigarettes.  She started smoking about 45 years ago. She has a 40 pack-year smoking history. She has never used smokeless tobacco. She reports that she drinks  about 25.2 ounces of alcohol per week. She reports that she does not use illicit drugs.  Allergies:  Allergies  Allergen Reactions  . Codeine Other (See Comments)    Chest pain    Medications:   Medication List    ASK your doctor about these medications       acetaminophen 500 MG tablet  Commonly known as:  TYLENOL  Take 1,000 mg by mouth every 6 (six) hours as needed (pain).     amoxicillin-clavulanate 875-125 MG per tablet  Commonly known as:  AUGMENTIN  Take 1 tablet by mouth 2 (two) times daily. 5 day course started 10/01/13     etodolac 400 MG tablet  Commonly known as:  LODINE  Take 400 mg by mouth 2 (two) times daily.     HYDROcodone-acetaminophen 5-325 MG per tablet  Commonly known as:  NORCO/VICODIN  Take 1 tablet by mouth every 6 (six) hours as needed for moderate pain.     levothyroxine 112 MCG tablet  Commonly known as:  SYNTHROID, LEVOTHROID  Take 1 tablet (112 mcg total) by mouth daily.     predniSONE 20 MG tablet  Commonly known as:  DELTASONE  Take 40 mg by mouth daily with breakfast.     vitamin C 1000 MG tablet  Take 1,000 mg by mouth daily.     Vitamin D 2000 UNITS tablet  Take 2,000 Units by mouth daily.       Please HPI for pertinent positives,  otherwise complete 10 system ROS negative.  Physical Exam: BP 146/89  Pulse 90  Temp(Src) 98.7 F (37.1 C) (Oral)  Resp 20  Ht 5' 4.5" (1.638 m)  Wt 200 lb (90.719 kg)  BMI 33.81 kg/m2  SpO2 99% Body mass index is 33.81 kg/(m^2).  General Appearance:  Alert, cooperative, no distress  Head:  Normocephalic, without obvious abnormality, atraumatic  Neck: Supple, symmetrical, trachea midline  Lungs:   Exp wheezes bilaterally.   Chest Wall:  No tenderness or deformity  Heart:  Regular rate and rhythm, S1, S2 normal, no murmur, rub or gallop.  Abdomen:   Soft, non-tender, non distended, (+) BS  Extremities: Extremities normal, atraumatic, no cyanosis or edema  Neurologic: Normal affect, no gross  deficits.   Results for orders placed during the hospital encounter of 10/17/13 (from the past 48 hour(s))  APTT     Status: None   Collection Time    10/17/13  8:03 AM      Result Value Ref Range   aPTT 29  24 - 37 seconds  CBC     Status: Abnormal   Collection Time    10/17/13  8:03 AM      Result Value Ref Range   WBC 6.9  4.0 - 10.5 K/uL   RBC 4.13  3.87 - 5.11 MIL/uL   Hemoglobin 14.3  12.0 - 15.0 g/dL   HCT 43.3  36.0 - 46.0 %   MCV 104.8 (*) 78.0 - 100.0 fL   MCH 34.6 (*) 26.0 - 34.0 pg   MCHC 33.0  30.0 - 36.0 g/dL   RDW 14.7  11.5 - 15.5 %   Platelets 234  150 - 400 K/uL  PROTIME-INR     Status: Abnormal   Collection Time    10/17/13  8:03 AM      Result Value Ref Range   Prothrombin Time 11.4 (*) 11.6 - 15.2 seconds   INR 0.84  0.00 - 1.49   No results found.  Assessment/Plan History of wegener's granulomatosis 2009 has been in remission Chest pain Fatigue Shortness of breath CT and PET findings with left lower lobe lung nodule, patient has been on prednisone and size of nodule has slightly decreased. Dr. Barbie Banner has spoke with Dr. Ouida Sills today over the phone regarding decrease in size of nodule with prednisone.  Dr. Ouida Sills would like IR to move forward with left lower lobe lung nodule biopsy today to rule out malignancy.  Patient has been NPO, no blood thinners, labs and images reviewed. Risks and Benefits discussed with the patient including, but not limited to bleeding, infection, pneumothorax, or even death. All of the patient's questions were answered, patient is agreeable to proceed. Consent signed and in chart.   Hedy Jacob PA-C 10/17/2013, 9:07 AM

## 2013-12-26 ENCOUNTER — Other Ambulatory Visit: Payer: Self-pay | Admitting: Rheumatology

## 2013-12-26 DIAGNOSIS — M3130 Wegener's granulomatosis without renal involvement: Secondary | ICD-10-CM

## 2014-01-03 ENCOUNTER — Ambulatory Visit
Admission: RE | Admit: 2014-01-03 | Discharge: 2014-01-03 | Disposition: A | Discharge status: Home / Self Care | Payer: 59 | Source: Ambulatory Visit | Attending: Rheumatology | Admitting: Rheumatology

## 2014-01-03 DIAGNOSIS — M3130 Wegener's granulomatosis without renal involvement: Secondary | ICD-10-CM

## 2014-01-07 ENCOUNTER — Other Ambulatory Visit: Payer: BC Managed Care – PPO

## 2014-01-09 ENCOUNTER — Other Ambulatory Visit: Payer: 59

## 2014-04-21 ENCOUNTER — Other Ambulatory Visit: Payer: Self-pay | Admitting: *Deleted

## 2014-04-21 MED ORDER — LEVOTHYROXINE SODIUM 112 MCG PO TABS: ORAL_TABLET | ORAL | Status: DC

## 2014-05-30 DIAGNOSIS — M069 Rheumatoid arthritis, unspecified: Secondary | ICD-10-CM

## 2014-05-30 HISTORY — DX: Rheumatoid arthritis, unspecified: M06.9

## 2016-03-18 ENCOUNTER — Ambulatory Visit (HOSPITAL_COMMUNITY)
Admission: EM | Admit: 2016-03-18 | Discharge: 2016-03-18 | Disposition: A | Discharge status: Home / Self Care | Payer: 59 | Attending: Family Medicine | Admitting: Family Medicine

## 2016-03-18 ENCOUNTER — Encounter (HOSPITAL_COMMUNITY): Payer: Self-pay | Admitting: Family Medicine

## 2016-03-18 DIAGNOSIS — J40 Bronchitis, not specified as acute or chronic: Secondary | ICD-10-CM

## 2016-03-18 MED ORDER — PREDNISONE 20 MG PO TABS: ORAL_TABLET | ORAL | 0 refills | Status: DC

## 2016-03-18 MED ORDER — LEVOFLOXACIN 500 MG PO TABS: 500.0000 mg | ORAL_TABLET | Freq: Every day | ORAL | 0 refills | Status: DC

## 2016-03-18 NOTE — Discharge Instructions (Signed)
Hold off on the Humira until you see improvement over the weekend.

## 2016-03-18 NOTE — ED Triage Notes (Signed)
Here for cold sx onset yest associated w/prod cough, abd pain due to cough, runny nose and nasal congestion  Denies fevers, chills  A&O x4... NAD

## 2016-03-18 NOTE — ED Provider Notes (Signed)
[place]    CSN: [id number] Arrival date & time: 03/18/16  1227     History   Chief Complaint Chief Complaint  Patient presents with  . URI    HPI [name] is a 65 y.o. female.   This is 65 year old woman who presents with upper respiratory infection symptoms. She's been diagnosed with Wegener's granulomatosis in the past. She is also being treated for hypothyroidism. She is a smoker and has been diagnosed with obesity.  The patient does property management.  Patient has had wheezing for 6 weeks. She initially saw her doctor who prescribed Augmentin and after 10 days she seemed to be significant white better but never completely cleared. She also has nebulizer treatments at home which help. She's had no fever. She's had no hemoptysis. She's had no epistaxis.  Patient is on Humira and methotrexate for her rheumatoid arthritis and Wegener's granulomatosis. She realizes that she is immunocompromised on these medicines.      Past Medical History:  Diagnosis Date  . Diverticulosis    by colonoscopy  . Hypothyroidism 1980s  . Microhematuria 2000s   s/p normal w/u per patient  . Obesity   . Smoker 1970  . Wegener's granulomatosis ([place]) 2009   in remission Darin Engels at [facility])    Patient Active Problem List   Diagnosis Date Noted  . Healthcare maintenance 02/11/2013  . Hypothyroidism   . Smoker   . Obesity   . Diverticulosis   . WEGENERS GRANULOMATOSIS 11/14/2007  . PULMONARY NODULE 09/27/2007    Past Surgical History:  Procedure Laterality Date  . APPENDECTOMY  1980s  . CHOLECYSTECTOMY  1980s  . COLONOSCOPY  2013   hyperplastic polyp, diverticulosis rpt 10 yrs Henrene Pastor)  . TONSILLECTOMY  child  . VIDEO ASSISTED THORACOSCOPY (VATS)/THOROCOTOMY Right 2009   removal of scar tissue; nodule biopsy x3    OB History    No data available       Home Medications    Prior to Admission medications   Medication Sig Start Date End Date  Taking? Authorizing Provider  Cholecalciferol (VITAMIN D) 2000 UNITS tablet Take 2,000 Units by mouth daily.   Yes Historical Provider, MD  folic acid (FOLVITE) Q000111Q MCG tablet Take 400 mcg by mouth daily.   Yes Historical Provider, MD  acetaminophen (TYLENOL) 500 MG tablet Take 1,000 mg by mouth every 6 (six) hours as needed (pain).    Historical Provider, MD  Ascorbic Acid (VITAMIN C) 1000 MG tablet Take 1,000 mg by mouth daily.    Historical Provider, MD  etodolac (LODINE) 400 MG tablet Take 400 mg by mouth 2 (two) times daily.    Historical Provider, MD  HYDROcodone-acetaminophen (NORCO/VICODIN) 5-325 MG per tablet Take 1 tablet by mouth every 6 (six) hours as needed for moderate pain.    Historical Provider, MD  levofloxacin (LEVAQUIN) 500 MG tablet Take 1 tablet (500 mg total) by mouth daily. 03/18/16   Robyn Haber, MD  levothyroxine (SYNTHROID, LEVOTHROID) 112 MCG tablet Take one tablet every day. **MUST HAVE PHYSICAL FOR FURTHER REFILLS** 04/21/14   Ria Bush, MD  predniSONE (DELTASONE) 20 MG tablet Take 40 mg by mouth daily with breakfast.    Historical Provider, MD  predniSONE (DELTASONE) 20 MG tablet One daily with food 03/18/16   Robyn Haber, MD    Family History Family History  Problem Relation Age of Onset  . Adopted: Yes  . Cancer Mother 66    breast  . CAD Father  MI    Social History Social History  Substance Use Topics  . Smoking status: Current Every Day Smoker    Packs/day: 1.00    Years: 40.00    Types: Cigarettes    Start date: 05/30/1968  . Smokeless tobacco: Never Used  . Alcohol use 25.2 oz/week    42 Cans of beer per week     Comment: regular (beer)     Allergies   Codeine   Review of Systems Review of Systems  Constitutional: Negative.   HENT: Positive for congestion and sneezing. Negative for nosebleeds, rhinorrhea, sinus pressure and sore throat.   Eyes: Negative.   Respiratory: Positive for cough, chest tightness and  wheezing.   Cardiovascular: Negative.   Gastrointestinal: Negative.   Musculoskeletal: Negative.   Neurological: Negative.   Psychiatric/Behavioral: Negative.      Physical Exam Triage Vital Signs ED Triage Vitals  Enc Vitals Group     BP 03/18/16 1240 133/72     Pulse Rate 03/18/16 1240 92     Resp 03/18/16 1240 16     Temp 03/18/16 1240 98.6 F (37 C)     Temp Source 03/18/16 1240 Oral     SpO2 03/18/16 1240 96 %     Weight --      Height --      Head Circumference --      Peak Flow --      Pain Score 03/18/16 1255 4     Pain Loc --      Pain Edu? --      Excl. in [place]? --    No data found.   Updated Vital Signs BP 133/72 (BP Location: Left Arm)   Pulse 92   Temp 98.6 F (37 C) (Oral)   Resp 16   SpO2 96%      Physical Exam  Constitutional: She is oriented to person, place, and time. She appears well-developed and well-nourished.  HENT:  Head: Normocephalic.  Right Ear: External ear normal.  Left Ear: External ear normal.  Mouth/Throat: Oropharynx is clear and moist.  Eyes: Conjunctivae and EOM are normal. Pupils are equal, round, and reactive to light.  Neck: Neck supple.  Cardiovascular: Normal rate, regular rhythm and normal heart sounds.   Pulmonary/Chest: She has wheezes. She exhibits no tenderness.  Patient is somewhat tight when she breathes.  Musculoskeletal: Normal range of motion.  Neurological: She is alert and oriented to person, place, and time.  Skin: Skin is warm and dry.  Psychiatric: She has a normal mood and affect.  Nursing note and vitals reviewed.    UC Treatments / Results  Labs (all labs ordered are listed, but only abnormal results are displayed) Labs Reviewed - No data to display  EKG  EKG Interpretation None       Radiology No results found.  Procedures Procedures (including critical care time)  Medications Ordered in UC Medications - No data to display   Initial Impression / Assessment and Plan / UC Course    I have reviewed the triage vital signs and the nursing notes.  Pertinent labs & imaging results that were available during my care of the patient were reviewed by me and considered in my medical decision making (see chart for details).  Clinical Course    Final Clinical Impressions(s) / UC Diagnoses   Final diagnoses:  Bronchitis    New Prescriptions New Prescriptions   LEVOFLOXACIN (LEVAQUIN) 500 MG TABLET    Take 1 tablet (500 mg  total) by mouth daily.   PREDNISONE (DELTASONE) 20 MG TABLET    One daily with food     Robyn Haber, MD 03/18/16 1309

## 2016-07-08 ENCOUNTER — Ambulatory Visit (HOSPITAL_COMMUNITY)
Admission: EM | Admit: 2016-07-08 | Discharge: 2016-07-08 | Disposition: A | Discharge status: Home / Self Care | Payer: 59 | Attending: Internal Medicine | Admitting: Internal Medicine

## 2016-07-08 ENCOUNTER — Encounter (HOSPITAL_COMMUNITY): Payer: Self-pay | Admitting: Emergency Medicine

## 2016-07-08 DIAGNOSIS — J111 Influenza due to unidentified influenza virus with other respiratory manifestations: Secondary | ICD-10-CM

## 2016-07-08 DIAGNOSIS — R69 Illness, unspecified: Principal | ICD-10-CM

## 2016-07-08 MED ORDER — ACETAMINOPHEN 325 MG PO TABS
650.0000 mg | ORAL_TABLET | Freq: Once | ORAL | Status: AC
  Administered 2016-07-08: 650 mg via ORAL

## 2016-07-08 MED ORDER — OSELTAMIVIR PHOSPHATE 75 MG PO CAPS: 75.0000 mg | ORAL_CAPSULE | Freq: Two times a day (BID) | ORAL | 0 refills | Status: DC

## 2016-07-08 MED ORDER — LEVOFLOXACIN 500 MG PO TABS: 500.0000 mg | ORAL_TABLET | Freq: Every day | ORAL | 0 refills | Status: DC

## 2016-07-08 MED ORDER — ACETAMINOPHEN 325 MG PO TABS
ORAL_TABLET | ORAL | Status: AC
  Filled 2016-07-08: qty 2

## 2016-07-08 MED ORDER — PREDNISONE 20 MG PO TABS: ORAL_TABLET | ORAL | 0 refills | Status: DC

## 2016-07-08 NOTE — ED Triage Notes (Signed)
Pt c/o cold sx onset:   Sx include: prod cough, weakness, fatigue, nasal congestion/drainage  Denies: fevers  Taking: OTC cold meds w/temp relief.   A&O x4... NAD

## 2016-07-08 NOTE — ED Provider Notes (Signed)
[place]    CSN: [id number] Arrival date & time: 07/08/16  1158     History   Chief Complaint No chief complaint on file.   HPI [name] is a 66 y.o. female.   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 Has had some high fevers.  Fever 101.4 here. Has been taking some over-the-counter medications No nausea no vomiting No chest pain Some chills Is already on prednisone 5 mg daily.  Patient was treated in October for similar illness and felt better and treated with Levaquin and prednisone and is requesting antibiotic right now     Past Medical History:  Diagnosis Date  . Diverticulosis    by colonoscopy  . Hypothyroidism 1980s  . Microhematuria 2000s   s/p normal w/u per patient  . Obesity   . Smoker 1970  . Wegener's granulomatosis ([place]) 2009   in remission Darin Engels at [facility])    Patient Active Problem List   Diagnosis Date Noted  . Healthcare maintenance 02/11/2013  . Hypothyroidism   . Smoker   . Obesity   . Diverticulosis   . WEGENERS GRANULOMATOSIS 11/14/2007  . PULMONARY NODULE 09/27/2007    Past Surgical History:  Procedure Laterality Date  . APPENDECTOMY  1980s  . CHOLECYSTECTOMY  1980s  . COLONOSCOPY  2013   hyperplastic polyp, diverticulosis rpt 10 yrs Henrene Pastor)  . TONSILLECTOMY  child  . VIDEO ASSISTED THORACOSCOPY (VATS)/THOROCOTOMY Right 2009   removal of scar tissue; nodule biopsy x3    OB History    No data available       Home Medications    Prior to Admission medications   Medication Sig Start Date End Date Taking? Authorizing Provider  acetaminophen (TYLENOL) 500 MG tablet Take 1,000 mg by mouth every 6 (six) hours as needed (pain).    Historical Provider, MD  Adalimumab (HUMIRA [place]) Inject into the skin.    Historical Provider, MD    Ascorbic Acid (VITAMIN C) 1000 MG tablet Take 1,000 mg by mouth daily.    Historical Provider, MD  Cholecalciferol (VITAMIN D) 2000 UNITS tablet Take 2,000 Units by mouth daily.    Historical Provider, MD  etodolac (LODINE) 400 MG tablet Take 400 mg by mouth 2 (two) times daily.    Historical Provider, MD  folic acid (FOLVITE) Q000111Q MCG tablet Take 400 mcg by mouth daily.    Historical Provider, MD  HYDROcodone-acetaminophen (NORCO/VICODIN) 5-325 MG per tablet Take 1 tablet by mouth every 6 (six) hours as needed for moderate pain.    Historical Provider, MD  levofloxacin (LEVAQUIN) 500 MG tablet Take 1 tablet (500 mg total) by mouth daily. 03/18/16   Robyn Haber, MD  levothyroxine (SYNTHROID, LEVOTHROID) 112 MCG tablet Take one tablet every day. **MUST HAVE PHYSICAL FOR FURTHER REFILLS** 04/21/14   Ria Bush, MD  methotrexate (RHEUMATREX) 5 MG tablet Take 5 mg by mouth once a week. Caution: Chemotherapy. Protect from light.    Historical Provider, MD  predniSONE (DELTASONE) 20 MG tablet Take 40 mg by mouth daily with breakfast.    Historical Provider, MD  predniSONE (DELTASONE) 20 MG tablet One daily with food 03/18/16   Robyn Haber, MD    Family History Family History  Problem Relation Age of Onset  . Adopted: Yes  . Cancer Mother 70    breast  .  CAD Father     MI    Social History Social History  Substance Use Topics  . Smoking status: Current Every Day Smoker    Packs/day: 1.00    Years: 40.00    Types: Cigarettes    Start date: 05/30/1968  . Smokeless tobacco: Never Used  . Alcohol use 25.2 oz/week    42 Cans of beer per week     Comment: regular (beer)     Allergies   Codeine   Review of Systems Review of Systems  no dark stool, no tarry stool, no nausea, no chest pain, no hematemesis, no air pain, no sore throat   Physical Exam Triage Vital Signs ED Triage Vitals  Enc Vitals Group     BP      Pulse      Resp      Temp      Temp src      SpO2       Weight      Height      Head Circumference      Peak Flow      Pain Score      Pain Loc      Pain Edu?      Excl. in [place]?    No data found.   Updated Vital Signs There were no vitals taken for this visit.  Visual Acuity Right Eye Distance:   Left Eye Distance:   Bilateral Distance:    Right Eye Near:   Left Eye Near:    Bilateral Near:     Physical Exam  alert pleasant oriented flat affect No pallor no icterus Right tonsil appears a little bit enlarged Bilaterally has wax in both ears No submandibular lymph  No wheeze No tactile vocal resonance or fremitus   UC Treatments / Results  Labs (all labs ordered are listed, but only abnormal results are displayed) Labs Reviewed - No data to display  EKG  EKG Interpretation None       Radiology No results found.  Procedures Procedures (including critical care time)  Medications Ordered in UC Medications - No data to display   Initial Impression / Assessment and Plan / UC Course  I have reviewed the triage vital signs and the nursing notes.  Pertinent labs & imaging results that were available during my care of the patient were reviewed by me and considered in my medical decision making (see chart for details).     Possible exacerbation COPD, possible viral illness  Final Clinical Impressions(s) / UC Diagnoses   Final diagnoses:  None   Patient absolutely insists on getting an antibiotic I will prescribe Tamiflu twice a day for the patient Levaquin 500 for 5 days I would recommend that she increase her redness on 2 10 mg daily for the next 5 days now recommended her be out of work She slightly hypoxic at 92% however this is probably in keeping with her chronic smoking history and chronic lung disease from the Wegener's She slightly immunocompromised S fields is reasonable to give her maximal possible therapy for presumed respiratory illness I do not believe any imaging is necessary at this time  she appears nontoxic  New Prescriptions New Prescriptions   No medications on file     Nita Sells, MD 07/08/16 1308

## 2016-07-08 NOTE — Discharge Instructions (Signed)
Take higher dose of prednisone for about 5 days in addition to your antiviral and antibacterial medication Please follow-up with your lung doctor for further instructions regarding her illness and let him know that you have had a couple of episodes of upper rest or any illnesses This may represent a progression of your disease and you will need to talk to them regarding this

## 2016-10-28 ENCOUNTER — Other Ambulatory Visit: Payer: Self-pay | Admitting: Internal Medicine

## 2016-10-28 DIAGNOSIS — Z1231 Encounter for screening mammogram for malignant neoplasm of breast: Secondary | ICD-10-CM

## 2016-11-01 ENCOUNTER — Ambulatory Visit
Admission: RE | Admit: 2016-11-01 | Discharge: 2016-11-01 | Disposition: A | Discharge status: Home / Self Care | Payer: 59 | Source: Ambulatory Visit | Attending: Internal Medicine | Admitting: Internal Medicine

## 2016-11-01 DIAGNOSIS — Z1231 Encounter for screening mammogram for malignant neoplasm of breast: Secondary | ICD-10-CM

## 2017-01-11 DIAGNOSIS — E669 Obesity, unspecified: Secondary | ICD-10-CM | POA: Diagnosis not present

## 2017-01-11 DIAGNOSIS — M170 Bilateral primary osteoarthritis of knee: Secondary | ICD-10-CM | POA: Diagnosis not present

## 2017-01-11 DIAGNOSIS — Z6836 Body mass index (BMI) 36.0-36.9, adult: Secondary | ICD-10-CM | POA: Diagnosis not present

## 2017-01-11 DIAGNOSIS — M3130 Wegener's granulomatosis without renal involvement: Secondary | ICD-10-CM | POA: Diagnosis not present

## 2017-01-11 DIAGNOSIS — Z79899 Other long term (current) drug therapy: Secondary | ICD-10-CM | POA: Diagnosis not present

## 2017-01-11 DIAGNOSIS — M0579 Rheumatoid arthritis with rheumatoid factor of multiple sites without organ or systems involvement: Secondary | ICD-10-CM | POA: Diagnosis not present

## 2017-03-14 ENCOUNTER — Other Ambulatory Visit: Payer: Self-pay | Admitting: *Deleted

## 2017-03-14 ENCOUNTER — Encounter: Payer: Self-pay | Admitting: *Deleted

## 2017-03-14 NOTE — Patient Outreach (Signed)
[facility]) Care Management  03/14/2017  [name] 01-25-1951 [id number]   High Risk Assessment call  Spoke with patient explained reason for the call. Patient reports she is managing well at home. She declines any chronic conditions  listed on assessment , discussed her Wegeners condition. Patient reports she is still smoking, working on quitting, offered education support ,  declined any additional education support at this time. Patient denies problems with affording medications . Patient denies any other concerns at this time.   Patient agreeable to receiving letter regarding Care management information her  address verified.  Plan Will send successful outreach letter.   Joylene Draft, RN, [place] Management Coordinator  [phone]- Mobile [phone]- Toll Free Main Office

## 2017-04-27 DIAGNOSIS — Z6836 Body mass index (BMI) 36.0-36.9, adult: Secondary | ICD-10-CM | POA: Diagnosis not present

## 2017-04-27 DIAGNOSIS — M0579 Rheumatoid arthritis with rheumatoid factor of multiple sites without organ or systems involvement: Secondary | ICD-10-CM | POA: Diagnosis not present

## 2017-04-27 DIAGNOSIS — M170 Bilateral primary osteoarthritis of knee: Secondary | ICD-10-CM | POA: Diagnosis not present

## 2017-04-27 DIAGNOSIS — Z79899 Other long term (current) drug therapy: Secondary | ICD-10-CM | POA: Diagnosis not present

## 2017-04-27 DIAGNOSIS — E669 Obesity, unspecified: Secondary | ICD-10-CM | POA: Diagnosis not present

## 2017-04-27 DIAGNOSIS — M3130 Wegener's granulomatosis without renal involvement: Secondary | ICD-10-CM | POA: Diagnosis not present

## 2017-06-08 DIAGNOSIS — E039 Hypothyroidism, unspecified: Secondary | ICD-10-CM | POA: Diagnosis not present

## 2017-06-08 DIAGNOSIS — R5383 Other fatigue: Secondary | ICD-10-CM | POA: Diagnosis not present

## 2017-06-08 DIAGNOSIS — E538 Deficiency of other specified B group vitamins: Secondary | ICD-10-CM | POA: Diagnosis not present

## 2017-06-08 DIAGNOSIS — Z780 Asymptomatic menopausal state: Secondary | ICD-10-CM | POA: Diagnosis not present

## 2017-06-20 DIAGNOSIS — E039 Hypothyroidism, unspecified: Secondary | ICD-10-CM | POA: Diagnosis not present

## 2017-06-20 DIAGNOSIS — M3130 Wegener's granulomatosis without renal involvement: Secondary | ICD-10-CM | POA: Diagnosis not present

## 2017-06-20 DIAGNOSIS — E538 Deficiency of other specified B group vitamins: Secondary | ICD-10-CM | POA: Diagnosis not present

## 2017-06-20 DIAGNOSIS — M0579 Rheumatoid arthritis with rheumatoid factor of multiple sites without organ or systems involvement: Secondary | ICD-10-CM | POA: Diagnosis not present

## 2017-08-23 DIAGNOSIS — R05 Cough: Secondary | ICD-10-CM | POA: Diagnosis not present

## 2017-08-23 DIAGNOSIS — Z6835 Body mass index (BMI) 35.0-35.9, adult: Secondary | ICD-10-CM | POA: Diagnosis not present

## 2017-08-23 DIAGNOSIS — M170 Bilateral primary osteoarthritis of knee: Secondary | ICD-10-CM | POA: Diagnosis not present

## 2017-08-23 DIAGNOSIS — M3130 Wegener's granulomatosis without renal involvement: Secondary | ICD-10-CM | POA: Diagnosis not present

## 2017-08-23 DIAGNOSIS — E669 Obesity, unspecified: Secondary | ICD-10-CM | POA: Diagnosis not present

## 2017-08-23 DIAGNOSIS — Z79899 Other long term (current) drug therapy: Secondary | ICD-10-CM | POA: Diagnosis not present

## 2017-08-23 DIAGNOSIS — M0579 Rheumatoid arthritis with rheumatoid factor of multiple sites without organ or systems involvement: Secondary | ICD-10-CM | POA: Diagnosis not present

## 2017-08-31 DIAGNOSIS — R05 Cough: Secondary | ICD-10-CM | POA: Diagnosis not present

## 2017-08-31 DIAGNOSIS — M3130 Wegener's granulomatosis without renal involvement: Secondary | ICD-10-CM | POA: Diagnosis not present

## 2017-08-31 DIAGNOSIS — F17210 Nicotine dependence, cigarettes, uncomplicated: Secondary | ICD-10-CM | POA: Diagnosis not present

## 2017-08-31 DIAGNOSIS — M0579 Rheumatoid arthritis with rheumatoid factor of multiple sites without organ or systems involvement: Secondary | ICD-10-CM | POA: Diagnosis not present

## 2017-10-31 DIAGNOSIS — M170 Bilateral primary osteoarthritis of knee: Secondary | ICD-10-CM | POA: Diagnosis not present

## 2017-10-31 DIAGNOSIS — M3130 Wegener's granulomatosis without renal involvement: Secondary | ICD-10-CM | POA: Diagnosis not present

## 2017-10-31 DIAGNOSIS — M0579 Rheumatoid arthritis with rheumatoid factor of multiple sites without organ or systems involvement: Secondary | ICD-10-CM | POA: Diagnosis not present

## 2017-10-31 DIAGNOSIS — Z6836 Body mass index (BMI) 36.0-36.9, adult: Secondary | ICD-10-CM | POA: Diagnosis not present

## 2017-10-31 DIAGNOSIS — Z79899 Other long term (current) drug therapy: Secondary | ICD-10-CM | POA: Diagnosis not present

## 2017-10-31 DIAGNOSIS — R05 Cough: Secondary | ICD-10-CM | POA: Diagnosis not present

## 2017-10-31 DIAGNOSIS — E669 Obesity, unspecified: Secondary | ICD-10-CM | POA: Diagnosis not present

## 2017-11-09 DIAGNOSIS — L720 Epidermal cyst: Secondary | ICD-10-CM | POA: Diagnosis not present

## 2017-11-09 DIAGNOSIS — R208 Other disturbances of skin sensation: Secondary | ICD-10-CM | POA: Diagnosis not present

## 2018-01-02 DIAGNOSIS — E039 Hypothyroidism, unspecified: Secondary | ICD-10-CM | POA: Diagnosis not present

## 2018-01-02 DIAGNOSIS — E538 Deficiency of other specified B group vitamins: Secondary | ICD-10-CM | POA: Diagnosis not present

## 2018-01-02 DIAGNOSIS — M0579 Rheumatoid arthritis with rheumatoid factor of multiple sites without organ or systems involvement: Secondary | ICD-10-CM | POA: Diagnosis not present

## 2018-01-09 DIAGNOSIS — M3130 Wegener's granulomatosis without renal involvement: Secondary | ICD-10-CM | POA: Diagnosis not present

## 2018-01-09 DIAGNOSIS — M0579 Rheumatoid arthritis with rheumatoid factor of multiple sites without organ or systems involvement: Secondary | ICD-10-CM | POA: Diagnosis not present

## 2018-01-09 DIAGNOSIS — E039 Hypothyroidism, unspecified: Secondary | ICD-10-CM | POA: Diagnosis not present

## 2018-01-09 DIAGNOSIS — D7589 Other specified diseases of blood and blood-forming organs: Secondary | ICD-10-CM | POA: Diagnosis not present

## 2018-01-09 DIAGNOSIS — Z0000 Encounter for general adult medical examination without abnormal findings: Secondary | ICD-10-CM | POA: Diagnosis not present

## 2018-06-06 DIAGNOSIS — M170 Bilateral primary osteoarthritis of knee: Secondary | ICD-10-CM | POA: Diagnosis not present

## 2018-06-06 DIAGNOSIS — M0579 Rheumatoid arthritis with rheumatoid factor of multiple sites without organ or systems involvement: Secondary | ICD-10-CM | POA: Diagnosis not present

## 2018-06-06 DIAGNOSIS — M3130 Wegener's granulomatosis without renal involvement: Secondary | ICD-10-CM | POA: Diagnosis not present

## 2018-06-06 DIAGNOSIS — E669 Obesity, unspecified: Secondary | ICD-10-CM | POA: Diagnosis not present

## 2018-06-06 DIAGNOSIS — Z6836 Body mass index (BMI) 36.0-36.9, adult: Secondary | ICD-10-CM | POA: Diagnosis not present

## 2018-06-06 DIAGNOSIS — Z79899 Other long term (current) drug therapy: Secondary | ICD-10-CM | POA: Diagnosis not present

## 2018-07-06 DIAGNOSIS — M3130 Wegener's granulomatosis without renal involvement: Secondary | ICD-10-CM | POA: Diagnosis not present

## 2018-07-06 DIAGNOSIS — J0100 Acute maxillary sinusitis, unspecified: Secondary | ICD-10-CM | POA: Diagnosis not present

## 2018-09-11 DIAGNOSIS — E039 Hypothyroidism, unspecified: Secondary | ICD-10-CM | POA: Diagnosis not present

## 2018-09-11 DIAGNOSIS — I10 Essential (primary) hypertension: Secondary | ICD-10-CM | POA: Diagnosis not present

## 2018-09-11 DIAGNOSIS — D7589 Other specified diseases of blood and blood-forming organs: Secondary | ICD-10-CM | POA: Diagnosis not present

## 2018-09-11 DIAGNOSIS — E7800 Pure hypercholesterolemia, unspecified: Secondary | ICD-10-CM | POA: Diagnosis not present

## 2018-09-11 DIAGNOSIS — I2510 Atherosclerotic heart disease of native coronary artery without angina pectoris: Secondary | ICD-10-CM | POA: Diagnosis not present

## 2018-09-18 DIAGNOSIS — M0579 Rheumatoid arthritis with rheumatoid factor of multiple sites without organ or systems involvement: Secondary | ICD-10-CM | POA: Diagnosis not present

## 2018-09-18 DIAGNOSIS — E039 Hypothyroidism, unspecified: Secondary | ICD-10-CM | POA: Diagnosis not present

## 2018-09-18 DIAGNOSIS — E538 Deficiency of other specified B group vitamins: Secondary | ICD-10-CM | POA: Diagnosis not present

## 2018-09-18 DIAGNOSIS — M3130 Wegener's granulomatosis without renal involvement: Secondary | ICD-10-CM | POA: Diagnosis not present

## 2018-09-18 DIAGNOSIS — D7589 Other specified diseases of blood and blood-forming organs: Secondary | ICD-10-CM | POA: Diagnosis not present

## 2018-12-11 DIAGNOSIS — Z79899 Other long term (current) drug therapy: Secondary | ICD-10-CM | POA: Diagnosis not present

## 2018-12-11 DIAGNOSIS — M0579 Rheumatoid arthritis with rheumatoid factor of multiple sites without organ or systems involvement: Secondary | ICD-10-CM | POA: Diagnosis not present

## 2018-12-11 DIAGNOSIS — M170 Bilateral primary osteoarthritis of knee: Secondary | ICD-10-CM | POA: Diagnosis not present

## 2018-12-11 DIAGNOSIS — E669 Obesity, unspecified: Secondary | ICD-10-CM | POA: Diagnosis not present

## 2018-12-11 DIAGNOSIS — M3130 Wegener's granulomatosis without renal involvement: Secondary | ICD-10-CM | POA: Diagnosis not present

## 2018-12-11 DIAGNOSIS — Z6836 Body mass index (BMI) 36.0-36.9, adult: Secondary | ICD-10-CM | POA: Diagnosis not present

## 2018-12-20 DIAGNOSIS — L308 Other specified dermatitis: Secondary | ICD-10-CM | POA: Diagnosis not present

## 2019-01-29 DIAGNOSIS — E039 Hypothyroidism, unspecified: Secondary | ICD-10-CM | POA: Diagnosis not present

## 2019-01-29 DIAGNOSIS — M3130 Wegener's granulomatosis without renal involvement: Secondary | ICD-10-CM | POA: Diagnosis not present

## 2019-01-29 DIAGNOSIS — M0579 Rheumatoid arthritis with rheumatoid factor of multiple sites without organ or systems involvement: Secondary | ICD-10-CM | POA: Diagnosis not present

## 2019-01-29 DIAGNOSIS — E538 Deficiency of other specified B group vitamins: Secondary | ICD-10-CM | POA: Diagnosis not present

## 2019-02-14 DIAGNOSIS — M0579 Rheumatoid arthritis with rheumatoid factor of multiple sites without organ or systems involvement: Secondary | ICD-10-CM | POA: Diagnosis not present

## 2019-02-14 DIAGNOSIS — Z0000 Encounter for general adult medical examination without abnormal findings: Secondary | ICD-10-CM | POA: Diagnosis not present

## 2019-02-14 DIAGNOSIS — M3130 Wegener's granulomatosis without renal involvement: Secondary | ICD-10-CM | POA: Diagnosis not present

## 2019-02-14 DIAGNOSIS — E039 Hypothyroidism, unspecified: Secondary | ICD-10-CM | POA: Diagnosis not present

## 2019-02-14 DIAGNOSIS — Z7189 Other specified counseling: Secondary | ICD-10-CM | POA: Diagnosis not present

## 2019-02-14 DIAGNOSIS — R5383 Other fatigue: Secondary | ICD-10-CM | POA: Diagnosis not present

## 2019-04-04 DIAGNOSIS — M0579 Rheumatoid arthritis with rheumatoid factor of multiple sites without organ or systems involvement: Secondary | ICD-10-CM | POA: Diagnosis not present

## 2019-04-04 DIAGNOSIS — Z79899 Other long term (current) drug therapy: Secondary | ICD-10-CM | POA: Diagnosis not present

## 2019-04-04 DIAGNOSIS — E669 Obesity, unspecified: Secondary | ICD-10-CM | POA: Diagnosis not present

## 2019-04-04 DIAGNOSIS — Z6836 Body mass index (BMI) 36.0-36.9, adult: Secondary | ICD-10-CM | POA: Diagnosis not present

## 2019-04-04 DIAGNOSIS — M170 Bilateral primary osteoarthritis of knee: Secondary | ICD-10-CM | POA: Diagnosis not present

## 2019-04-04 DIAGNOSIS — Z23 Encounter for immunization: Secondary | ICD-10-CM | POA: Diagnosis not present

## 2019-04-04 DIAGNOSIS — M3130 Wegener's granulomatosis without renal involvement: Secondary | ICD-10-CM | POA: Diagnosis not present

## 2019-04-22 ENCOUNTER — Other Ambulatory Visit: Payer: Self-pay

## 2019-08-08 DIAGNOSIS — M0579 Rheumatoid arthritis with rheumatoid factor of multiple sites without organ or systems involvement: Secondary | ICD-10-CM | POA: Diagnosis not present

## 2019-08-08 DIAGNOSIS — M170 Bilateral primary osteoarthritis of knee: Secondary | ICD-10-CM | POA: Diagnosis not present

## 2019-08-08 DIAGNOSIS — M3130 Wegener's granulomatosis without renal involvement: Secondary | ICD-10-CM | POA: Diagnosis not present

## 2019-08-08 DIAGNOSIS — E669 Obesity, unspecified: Secondary | ICD-10-CM | POA: Diagnosis not present

## 2019-08-08 DIAGNOSIS — Z79899 Other long term (current) drug therapy: Secondary | ICD-10-CM | POA: Diagnosis not present

## 2019-08-08 DIAGNOSIS — Z6836 Body mass index (BMI) 36.0-36.9, adult: Secondary | ICD-10-CM | POA: Diagnosis not present

## 2019-09-19 DIAGNOSIS — M0579 Rheumatoid arthritis with rheumatoid factor of multiple sites without organ or systems involvement: Secondary | ICD-10-CM | POA: Diagnosis not present

## 2019-09-19 DIAGNOSIS — R5383 Other fatigue: Secondary | ICD-10-CM | POA: Diagnosis not present

## 2019-09-19 DIAGNOSIS — E039 Hypothyroidism, unspecified: Secondary | ICD-10-CM | POA: Diagnosis not present

## 2019-09-26 DIAGNOSIS — E039 Hypothyroidism, unspecified: Secondary | ICD-10-CM | POA: Diagnosis not present

## 2019-09-26 DIAGNOSIS — E538 Deficiency of other specified B group vitamins: Secondary | ICD-10-CM | POA: Diagnosis not present

## 2019-09-26 DIAGNOSIS — M0579 Rheumatoid arthritis with rheumatoid factor of multiple sites without organ or systems involvement: Secondary | ICD-10-CM | POA: Diagnosis not present

## 2019-09-26 DIAGNOSIS — M3130 Wegener's granulomatosis without renal involvement: Secondary | ICD-10-CM | POA: Diagnosis not present

## 2019-11-07 DIAGNOSIS — E669 Obesity, unspecified: Secondary | ICD-10-CM | POA: Diagnosis not present

## 2019-11-07 DIAGNOSIS — M0579 Rheumatoid arthritis with rheumatoid factor of multiple sites without organ or systems involvement: Secondary | ICD-10-CM | POA: Diagnosis not present

## 2019-11-07 DIAGNOSIS — Z6835 Body mass index (BMI) 35.0-35.9, adult: Secondary | ICD-10-CM | POA: Diagnosis not present

## 2019-11-07 DIAGNOSIS — M170 Bilateral primary osteoarthritis of knee: Secondary | ICD-10-CM | POA: Diagnosis not present

## 2019-11-07 DIAGNOSIS — M3130 Wegener's granulomatosis without renal involvement: Secondary | ICD-10-CM | POA: Diagnosis not present

## 2019-11-07 DIAGNOSIS — Z79899 Other long term (current) drug therapy: Secondary | ICD-10-CM | POA: Diagnosis not present

## 2020-01-14 ENCOUNTER — Other Ambulatory Visit: Payer: Self-pay | Admitting: Internal Medicine

## 2020-01-14 DIAGNOSIS — Z1231 Encounter for screening mammogram for malignant neoplasm of breast: Secondary | ICD-10-CM

## 2020-01-23 ENCOUNTER — Ambulatory Visit
Admission: RE | Admit: 2020-01-23 | Discharge: 2020-01-23 | Disposition: A | Discharge status: Home / Self Care | Payer: PPO | Source: Ambulatory Visit | Attending: Internal Medicine | Admitting: Internal Medicine

## 2020-01-23 ENCOUNTER — Other Ambulatory Visit: Payer: Self-pay

## 2020-01-23 DIAGNOSIS — Z1231 Encounter for screening mammogram for malignant neoplasm of breast: Secondary | ICD-10-CM | POA: Diagnosis not present

## 2020-01-28 ENCOUNTER — Other Ambulatory Visit: Payer: Self-pay | Admitting: Internal Medicine

## 2020-01-28 DIAGNOSIS — R928 Other abnormal and inconclusive findings on diagnostic imaging of breast: Secondary | ICD-10-CM

## 2020-01-30 ENCOUNTER — Other Ambulatory Visit: Payer: Self-pay | Admitting: Internal Medicine

## 2020-01-30 ENCOUNTER — Other Ambulatory Visit: Payer: Self-pay

## 2020-01-30 ENCOUNTER — Ambulatory Visit
Admission: RE | Admit: 2020-01-30 | Discharge: 2020-01-30 | Disposition: A | Discharge status: Home / Self Care | Payer: PPO | Source: Ambulatory Visit | Attending: Internal Medicine | Admitting: Internal Medicine

## 2020-01-30 DIAGNOSIS — R928 Other abnormal and inconclusive findings on diagnostic imaging of breast: Secondary | ICD-10-CM | POA: Diagnosis not present

## 2020-01-30 DIAGNOSIS — R921 Mammographic calcification found on diagnostic imaging of breast: Secondary | ICD-10-CM

## 2020-02-04 DIAGNOSIS — Z6835 Body mass index (BMI) 35.0-35.9, adult: Secondary | ICD-10-CM | POA: Diagnosis not present

## 2020-02-04 DIAGNOSIS — M3130 Wegener's granulomatosis without renal involvement: Secondary | ICD-10-CM | POA: Diagnosis not present

## 2020-02-04 DIAGNOSIS — M0579 Rheumatoid arthritis with rheumatoid factor of multiple sites without organ or systems involvement: Secondary | ICD-10-CM | POA: Diagnosis not present

## 2020-02-04 DIAGNOSIS — J321 Chronic frontal sinusitis: Secondary | ICD-10-CM | POA: Diagnosis not present

## 2020-02-04 DIAGNOSIS — Z79899 Other long term (current) drug therapy: Secondary | ICD-10-CM | POA: Diagnosis not present

## 2020-02-04 DIAGNOSIS — E669 Obesity, unspecified: Secondary | ICD-10-CM | POA: Diagnosis not present

## 2020-02-04 DIAGNOSIS — M170 Bilateral primary osteoarthritis of knee: Secondary | ICD-10-CM | POA: Diagnosis not present

## 2020-02-12 ENCOUNTER — Other Ambulatory Visit: Payer: Self-pay

## 2020-02-12 ENCOUNTER — Other Ambulatory Visit: Payer: Self-pay | Admitting: Internal Medicine

## 2020-02-12 ENCOUNTER — Ambulatory Visit
Admission: RE | Admit: 2020-02-12 | Discharge: 2020-02-12 | Disposition: A | Discharge status: Home / Self Care | Payer: PPO | Source: Ambulatory Visit | Attending: Internal Medicine | Admitting: Internal Medicine

## 2020-02-12 DIAGNOSIS — R921 Mammographic calcification found on diagnostic imaging of breast: Secondary | ICD-10-CM

## 2020-02-12 DIAGNOSIS — N6489 Other specified disorders of breast: Secondary | ICD-10-CM | POA: Diagnosis not present

## 2020-02-20 DIAGNOSIS — E538 Deficiency of other specified B group vitamins: Secondary | ICD-10-CM | POA: Diagnosis not present

## 2020-02-20 DIAGNOSIS — E039 Hypothyroidism, unspecified: Secondary | ICD-10-CM | POA: Diagnosis not present

## 2020-02-20 DIAGNOSIS — M3130 Wegener's granulomatosis without renal involvement: Secondary | ICD-10-CM | POA: Diagnosis not present

## 2020-02-20 DIAGNOSIS — M0579 Rheumatoid arthritis with rheumatoid factor of multiple sites without organ or systems involvement: Secondary | ICD-10-CM | POA: Diagnosis not present

## 2020-02-25 DIAGNOSIS — E6609 Other obesity due to excess calories: Secondary | ICD-10-CM | POA: Diagnosis not present

## 2020-02-25 DIAGNOSIS — M0579 Rheumatoid arthritis with rheumatoid factor of multiple sites without organ or systems involvement: Secondary | ICD-10-CM | POA: Diagnosis not present

## 2020-02-25 DIAGNOSIS — Z0000 Encounter for general adult medical examination without abnormal findings: Secondary | ICD-10-CM | POA: Diagnosis not present

## 2020-02-25 DIAGNOSIS — Z23 Encounter for immunization: Secondary | ICD-10-CM | POA: Diagnosis not present

## 2020-02-25 DIAGNOSIS — R05 Cough: Secondary | ICD-10-CM | POA: Diagnosis not present

## 2020-02-25 DIAGNOSIS — E039 Hypothyroidism, unspecified: Secondary | ICD-10-CM | POA: Diagnosis not present

## 2020-02-25 DIAGNOSIS — M3130 Wegener's granulomatosis without renal involvement: Secondary | ICD-10-CM | POA: Diagnosis not present

## 2020-02-25 DIAGNOSIS — E538 Deficiency of other specified B group vitamins: Secondary | ICD-10-CM | POA: Diagnosis not present

## 2020-02-25 DIAGNOSIS — R5383 Other fatigue: Secondary | ICD-10-CM | POA: Diagnosis not present

## 2020-05-14 DIAGNOSIS — M0579 Rheumatoid arthritis with rheumatoid factor of multiple sites without organ or systems involvement: Secondary | ICD-10-CM | POA: Diagnosis not present

## 2020-05-14 DIAGNOSIS — Z6835 Body mass index (BMI) 35.0-35.9, adult: Secondary | ICD-10-CM | POA: Diagnosis not present

## 2020-05-14 DIAGNOSIS — J321 Chronic frontal sinusitis: Secondary | ICD-10-CM | POA: Diagnosis not present

## 2020-05-14 DIAGNOSIS — M170 Bilateral primary osteoarthritis of knee: Secondary | ICD-10-CM | POA: Diagnosis not present

## 2020-05-14 DIAGNOSIS — E669 Obesity, unspecified: Secondary | ICD-10-CM | POA: Diagnosis not present

## 2020-05-14 DIAGNOSIS — M3130 Wegener's granulomatosis without renal involvement: Secondary | ICD-10-CM | POA: Diagnosis not present

## 2020-05-14 DIAGNOSIS — Z79899 Other long term (current) drug therapy: Secondary | ICD-10-CM | POA: Diagnosis not present

## 2020-07-27 DIAGNOSIS — M0579 Rheumatoid arthritis with rheumatoid factor of multiple sites without organ or systems involvement: Secondary | ICD-10-CM | POA: Diagnosis not present

## 2020-07-27 DIAGNOSIS — E039 Hypothyroidism, unspecified: Secondary | ICD-10-CM | POA: Diagnosis not present

## 2020-08-13 DIAGNOSIS — M170 Bilateral primary osteoarthritis of knee: Secondary | ICD-10-CM | POA: Diagnosis not present

## 2020-08-13 DIAGNOSIS — M3130 Wegener's granulomatosis without renal involvement: Secondary | ICD-10-CM | POA: Diagnosis not present

## 2020-08-13 DIAGNOSIS — E669 Obesity, unspecified: Secondary | ICD-10-CM | POA: Diagnosis not present

## 2020-08-13 DIAGNOSIS — M0579 Rheumatoid arthritis with rheumatoid factor of multiple sites without organ or systems involvement: Secondary | ICD-10-CM | POA: Diagnosis not present

## 2020-08-13 DIAGNOSIS — Z79899 Other long term (current) drug therapy: Secondary | ICD-10-CM | POA: Diagnosis not present

## 2020-08-13 DIAGNOSIS — Z6834 Body mass index (BMI) 34.0-34.9, adult: Secondary | ICD-10-CM | POA: Diagnosis not present

## 2020-08-13 DIAGNOSIS — J321 Chronic frontal sinusitis: Secondary | ICD-10-CM | POA: Diagnosis not present

## 2020-08-27 DIAGNOSIS — E039 Hypothyroidism, unspecified: Secondary | ICD-10-CM | POA: Diagnosis not present

## 2020-08-27 DIAGNOSIS — M0579 Rheumatoid arthritis with rheumatoid factor of multiple sites without organ or systems involvement: Secondary | ICD-10-CM | POA: Diagnosis not present

## 2020-09-26 DIAGNOSIS — E039 Hypothyroidism, unspecified: Secondary | ICD-10-CM | POA: Diagnosis not present

## 2020-09-26 DIAGNOSIS — M0579 Rheumatoid arthritis with rheumatoid factor of multiple sites without organ or systems involvement: Secondary | ICD-10-CM | POA: Diagnosis not present

## 2020-10-06 DIAGNOSIS — M3130 Wegener's granulomatosis without renal involvement: Secondary | ICD-10-CM | POA: Diagnosis not present

## 2020-10-06 DIAGNOSIS — E539 Vitamin B deficiency, unspecified: Secondary | ICD-10-CM | POA: Diagnosis not present

## 2020-10-06 DIAGNOSIS — E6609 Other obesity due to excess calories: Secondary | ICD-10-CM | POA: Diagnosis not present

## 2020-10-06 DIAGNOSIS — E039 Hypothyroidism, unspecified: Secondary | ICD-10-CM | POA: Diagnosis not present

## 2020-10-06 DIAGNOSIS — M0579 Rheumatoid arthritis with rheumatoid factor of multiple sites without organ or systems involvement: Secondary | ICD-10-CM | POA: Diagnosis not present

## 2020-10-06 DIAGNOSIS — E538 Deficiency of other specified B group vitamins: Secondary | ICD-10-CM | POA: Diagnosis not present

## 2020-10-13 DIAGNOSIS — D7589 Other specified diseases of blood and blood-forming organs: Secondary | ICD-10-CM | POA: Diagnosis not present

## 2020-10-13 DIAGNOSIS — E039 Hypothyroidism, unspecified: Secondary | ICD-10-CM | POA: Diagnosis not present

## 2020-10-13 DIAGNOSIS — M3130 Wegener's granulomatosis without renal involvement: Secondary | ICD-10-CM | POA: Diagnosis not present

## 2020-10-27 DIAGNOSIS — E039 Hypothyroidism, unspecified: Secondary | ICD-10-CM | POA: Diagnosis not present

## 2020-10-27 DIAGNOSIS — M0579 Rheumatoid arthritis with rheumatoid factor of multiple sites without organ or systems involvement: Secondary | ICD-10-CM | POA: Diagnosis not present

## 2020-11-03 DIAGNOSIS — E039 Hypothyroidism, unspecified: Secondary | ICD-10-CM | POA: Diagnosis not present

## 2020-11-19 DIAGNOSIS — J321 Chronic frontal sinusitis: Secondary | ICD-10-CM | POA: Diagnosis not present

## 2020-11-19 DIAGNOSIS — R5383 Other fatigue: Secondary | ICD-10-CM | POA: Diagnosis not present

## 2020-11-19 DIAGNOSIS — E669 Obesity, unspecified: Secondary | ICD-10-CM | POA: Diagnosis not present

## 2020-11-19 DIAGNOSIS — Z79899 Other long term (current) drug therapy: Secondary | ICD-10-CM | POA: Diagnosis not present

## 2020-11-19 DIAGNOSIS — M0579 Rheumatoid arthritis with rheumatoid factor of multiple sites without organ or systems involvement: Secondary | ICD-10-CM | POA: Diagnosis not present

## 2020-11-19 DIAGNOSIS — M170 Bilateral primary osteoarthritis of knee: Secondary | ICD-10-CM | POA: Diagnosis not present

## 2020-11-19 DIAGNOSIS — M3130 Wegener's granulomatosis without renal involvement: Secondary | ICD-10-CM | POA: Diagnosis not present

## 2020-11-19 DIAGNOSIS — Z6834 Body mass index (BMI) 34.0-34.9, adult: Secondary | ICD-10-CM | POA: Diagnosis not present

## 2020-12-23 DIAGNOSIS — Z03818 Encounter for observation for suspected exposure to other biological agents ruled out: Secondary | ICD-10-CM | POA: Diagnosis not present

## 2020-12-23 DIAGNOSIS — Z20822 Contact with and (suspected) exposure to covid-19: Secondary | ICD-10-CM | POA: Diagnosis not present

## 2020-12-27 DIAGNOSIS — E039 Hypothyroidism, unspecified: Secondary | ICD-10-CM | POA: Diagnosis not present

## 2020-12-27 DIAGNOSIS — M0579 Rheumatoid arthritis with rheumatoid factor of multiple sites without organ or systems involvement: Secondary | ICD-10-CM | POA: Diagnosis not present

## 2020-12-28 DIAGNOSIS — Z03818 Encounter for observation for suspected exposure to other biological agents ruled out: Secondary | ICD-10-CM | POA: Diagnosis not present

## 2020-12-28 DIAGNOSIS — Z20822 Contact with and (suspected) exposure to covid-19: Secondary | ICD-10-CM | POA: Diagnosis not present

## 2021-02-23 DIAGNOSIS — R5383 Other fatigue: Secondary | ICD-10-CM | POA: Diagnosis not present

## 2021-02-23 DIAGNOSIS — E039 Hypothyroidism, unspecified: Secondary | ICD-10-CM | POA: Diagnosis not present

## 2021-03-02 DIAGNOSIS — E039 Hypothyroidism, unspecified: Secondary | ICD-10-CM | POA: Diagnosis not present

## 2021-03-02 DIAGNOSIS — F17210 Nicotine dependence, cigarettes, uncomplicated: Secondary | ICD-10-CM | POA: Diagnosis not present

## 2021-03-02 DIAGNOSIS — Z0000 Encounter for general adult medical examination without abnormal findings: Secondary | ICD-10-CM | POA: Diagnosis not present

## 2021-03-02 DIAGNOSIS — D7589 Other specified diseases of blood and blood-forming organs: Secondary | ICD-10-CM | POA: Diagnosis not present

## 2021-03-02 DIAGNOSIS — J410 Simple chronic bronchitis: Secondary | ICD-10-CM | POA: Diagnosis not present

## 2021-03-02 DIAGNOSIS — M3130 Wegener's granulomatosis without renal involvement: Secondary | ICD-10-CM | POA: Diagnosis not present

## 2021-03-02 DIAGNOSIS — Z23 Encounter for immunization: Secondary | ICD-10-CM | POA: Diagnosis not present

## 2021-03-02 DIAGNOSIS — M0579 Rheumatoid arthritis with rheumatoid factor of multiple sites without organ or systems involvement: Secondary | ICD-10-CM | POA: Diagnosis not present

## 2021-03-02 DIAGNOSIS — E782 Mixed hyperlipidemia: Secondary | ICD-10-CM | POA: Diagnosis not present

## 2021-03-02 DIAGNOSIS — E538 Deficiency of other specified B group vitamins: Secondary | ICD-10-CM | POA: Diagnosis not present

## 2021-03-02 DIAGNOSIS — N182 Chronic kidney disease, stage 2 (mild): Secondary | ICD-10-CM | POA: Diagnosis not present

## 2021-04-05 ENCOUNTER — Other Ambulatory Visit: Payer: Self-pay | Admitting: Internal Medicine

## 2021-04-05 DIAGNOSIS — Z1231 Encounter for screening mammogram for malignant neoplasm of breast: Secondary | ICD-10-CM

## 2021-04-08 ENCOUNTER — Other Ambulatory Visit: Payer: Self-pay

## 2021-04-08 ENCOUNTER — Ambulatory Visit
Admission: RE | Admit: 2021-04-08 | Discharge: 2021-04-08 | Disposition: A | Discharge status: Home / Self Care | Payer: PPO | Source: Ambulatory Visit | Attending: Internal Medicine | Admitting: Internal Medicine

## 2021-04-08 DIAGNOSIS — Z1231 Encounter for screening mammogram for malignant neoplasm of breast: Secondary | ICD-10-CM

## 2021-04-10 ENCOUNTER — Ambulatory Visit: Payer: PPO

## 2021-05-27 DIAGNOSIS — Z79899 Other long term (current) drug therapy: Secondary | ICD-10-CM | POA: Diagnosis not present

## 2021-05-27 DIAGNOSIS — R5383 Other fatigue: Secondary | ICD-10-CM | POA: Diagnosis not present

## 2021-05-27 DIAGNOSIS — M3130 Wegener's granulomatosis without renal involvement: Secondary | ICD-10-CM | POA: Diagnosis not present

## 2021-05-27 DIAGNOSIS — E669 Obesity, unspecified: Secondary | ICD-10-CM | POA: Diagnosis not present

## 2021-05-27 DIAGNOSIS — J321 Chronic frontal sinusitis: Secondary | ICD-10-CM | POA: Diagnosis not present

## 2021-05-27 DIAGNOSIS — M170 Bilateral primary osteoarthritis of knee: Secondary | ICD-10-CM | POA: Diagnosis not present

## 2021-05-27 DIAGNOSIS — Z6834 Body mass index (BMI) 34.0-34.9, adult: Secondary | ICD-10-CM | POA: Diagnosis not present

## 2021-05-27 DIAGNOSIS — M0579 Rheumatoid arthritis with rheumatoid factor of multiple sites without organ or systems involvement: Secondary | ICD-10-CM | POA: Diagnosis not present

## 2021-08-24 DIAGNOSIS — K649 Unspecified hemorrhoids: Secondary | ICD-10-CM | POA: Diagnosis not present

## 2021-08-24 DIAGNOSIS — M545 Low back pain, unspecified: Secondary | ICD-10-CM | POA: Diagnosis not present

## 2021-08-24 DIAGNOSIS — M0579 Rheumatoid arthritis with rheumatoid factor of multiple sites without organ or systems involvement: Secondary | ICD-10-CM | POA: Diagnosis not present

## 2021-08-26 DIAGNOSIS — M3130 Wegener's granulomatosis without renal involvement: Secondary | ICD-10-CM | POA: Diagnosis not present

## 2021-09-08 DIAGNOSIS — L309 Dermatitis, unspecified: Secondary | ICD-10-CM | POA: Diagnosis not present

## 2021-09-16 DIAGNOSIS — L565 Disseminated superficial actinic porokeratosis (DSAP): Secondary | ICD-10-CM | POA: Diagnosis not present

## 2021-09-16 DIAGNOSIS — D485 Neoplasm of uncertain behavior of skin: Secondary | ICD-10-CM | POA: Diagnosis not present

## 2021-09-16 DIAGNOSIS — L989 Disorder of the skin and subcutaneous tissue, unspecified: Secondary | ICD-10-CM | POA: Diagnosis not present

## 2021-09-17 NOTE — Progress Notes (Signed)
? ? ? ?09/20/2021 ?[name] ?[id number] ?12-14-1950 ? ?Referring provider: Merrilee Seashore, MD ?Primary GI doctor: Dr. Henrene Pastor ? ?ASSESSMENT AND PLAN:  ? ?History of colonic polyps ?03/06/2012 colonoscopy excellent prep with movie prep, diverticulosis and 5 mm hyperplastic polyp, recall 10 years. ?On recall list, may need sooner ? ?Anal fissure ?-     NON FORMULARY; Diltiazem 2%/Lidocaine compound Use 3 x rectally daily for 2 months to heal anal fissure ?We discussed anal fissures and long healing process ?Sitz baths, high-fiber diet, add on benefiber.  ?Long discussion about preventing constipation discussed in detail for prevention.  ?Diltiazem/lidocaine 3 x daily for 2 months [facility] ?Follow up should symptoms persist for secondary evaluation and possible surgical referral for repair. ? ? ?Patient Care Team: ?Merrilee Seashore, MD as PCP - General (Internal Medicine) ? ?HISTORY OF PRESENT ILLNESS: ?71 y.o. female with a past medical history of Wagner granulomatosis, RA on Humira, hypothyroidism, obesity, history of smoking, diverticulosis and others listed below presents for evaluation of hemorrhoids.  ? ?03/06/2012 colonoscopy excellent prep with movie prep, diverticulosis and 5 mm hyperplastic polyp, recall 10 years. ? ?No BM changes in 6 months.  ?No straining, will occ feel she needs to go but can't so will take gentle women's laxatives.  ?She still works 3 days a week and sits a lots.  ?She has had lower back pain around the same amount of time, had xray normal, prednisone packs made. No pain medications.  ?Hemorrhoids around the same time.  ?She has very small volume red blood on TP with wiping, has rectal pain/burning with wiping/sitting, will use preparation H suppositories. ?Sitz bathes have been better slightly.  ?Denies AB pain, weakness, changes in BM's.  ? ?Current Medications:  ? ?Current Outpatient Medications (Endocrine & Metabolic):  ?  levothyroxine  (SYNTHROID, LEVOTHROID) 112 MCG tablet, Take one tablet every day. **MUST HAVE PHYSICAL FOR FURTHER REFILLS** ?  predniSONE (DELTASONE) 20 MG tablet, One daily with food (Patient taking differently: Take 5 mg by mouth daily.) ? ? ? ?Current Outpatient Medications (Analgesics):  ?  acetaminophen (TYLENOL) 500 MG tablet, Take 1,000 mg by mouth every 6 (six) hours as needed (pain). ? ?Current Outpatient Medications (Hematological):  ?  folic acid (FOLVITE) 509 MCG tablet, Take 400 mcg by mouth daily. ? ?Current Outpatient Medications (Other):  ?  Cholecalciferol (VITAMIN D) 2000 UNITS tablet, Take 2,000 Units by mouth daily. ?  methotrexate (RHEUMATREX) 5 MG tablet, Take 30 mg by mouth once a week. Caution: Chemotherapy. Protect from light. ?  NON FORMULARY, Diltiazem 2%/Lidocaine compound Use 3 x rectally daily for 2 months to heal anal fissure ?  Turmeric (QC TUMERIC COMPLEX PO), Take 1,000 mg by mouth daily. ? ?Medical History:  ?Past Medical History:  ?Diagnosis Date  ? Diverticulosis   ? by colonoscopy  ? Hypothyroidism 1980s  ? Microhematuria 2000s  ? s/p normal w/u per patient  ? Obesity   ? Rheumatoid arthritis ([place]) 2016  ? Smoker 05/30/1968  ? Wegener's granulomatosis 05/31/2007  ? in remission Darin Engels at [facility])  ? ?Allergies:  ?Allergies  ?Allergen Reactions  ? Rituxan [Rituximab] Shortness Of Breath  ? Codeine Other (See Comments)  ?  Chest pain  ?  ? ?Surgical History:  ?She  has a past surgical history that includes Appendectomy (1980s); Cholecystectomy (1980s); Video assisted thoracoscopy (vats)/thorocotomy (Right, 2009); Tonsillectomy (child); and Colonoscopy (2013). ?Family History:  ?Her family history includes Breast cancer in her mother; CAD  in her father; Cancer (age of onset: 55) in her mother. She was adopted. ?Social History:  ? reports that she has been smoking cigarettes. She started smoking about 53 years ago. She has a 40.00 pack-year smoking history. She has never used smokeless  tobacco. She reports current alcohol use of about 42.0 standard drinks per week. She reports that she does not use drugs. ? ?REVIEW OF SYSTEMS  : All other systems reviewed and negative except where noted in the History of Present Illness. ? ? ?PHYSICAL EXAM: ?BP 124/78   Pulse 88   Ht '5\' 4"'$  (1.626 m)   Wt 189 lb (85.7 kg)   SpO2 98%   BMI 32.44 kg/m?  ?General:   Pleasant, well developed female in no acute distress ?Head:   Normocephalic and atraumatic. ?Eyes:  sclerae anicteric,conjunctive pink  ?Heart:   regular rate and rhythm ?Pulm:  diffuse decreased breath sounds ?Abdomen:   Soft, Obese AB, Active bowel sounds. No tenderness . , No organomegaly appreciated. ?Rectal: posterior fissure noted, normal rectal tone, + internal hemorrhoids appreciated, no masses, non tender, brown stool, hemoccult Negative ?Extremities:  Without edema. ?Msk: Symmetrical without gross deformities. Peripheral pulses intact.  ?Neurologic:  Alert and  oriented x4;  No focal deficits.  ?Skin:   Dry and intact without significant lesions or rashes. ?Psychiatric:  Cooperative. Normal mood and affect. ? ? ? ?Vladimir Crofts, PA-C ?10:36 AM ? ? ?

## 2021-09-20 ENCOUNTER — Ambulatory Visit: Payer: Medicare Other | Admitting: Physician Assistant

## 2021-09-20 ENCOUNTER — Encounter: Payer: Self-pay | Admitting: Physician Assistant

## 2021-09-20 VITALS — BP 124/78 | HR 88 | Ht 64.0 in | Wt 189.0 lb

## 2021-09-20 DIAGNOSIS — Z86010 Personal history of colonic polyps: Secondary | ICD-10-CM | POA: Diagnosis not present

## 2021-09-20 DIAGNOSIS — K602 Anal fissure, unspecified: Secondary | ICD-10-CM

## 2021-09-20 MED ORDER — NON FORMULARY: 1 refills | Status: DC

## 2021-09-20 NOTE — Patient Instructions (Addendum)
Anal Fissure, Adult ? ?Diltiazem/lidocaine 3 x daily for 2 months sent to compound pharmacy  ?Call if not improving, will do 3 month follow up with Dr. Henrene Pastor, if not improving can repeat colonoscopy sooner or refer to surgery for repair.  ? ?Sent this medication to a compound pharmacy: ? ?[facility] ?[address], [place], [place] [address] ?Phone: [phone] ? ? ?Please DO NOT go directly from our office to pick up this medication! Give the pharmacy 1 day to process the prescription. Extra time is required for them to compound your medication. ? ?Follow up should symptoms persist for secondary evaluation and possible surgical referral for repair. ?An anal fissure is a small tear or crack in the skin around the anus. Bleeding from a fissure usually stops on its own within a few minutes. However, bleeding will often occur again with each bowel movement until the crack heals. ?CAUSES ?This condition may be caused by: ?Passing large, hard stool (feces). ?Frequent diarrhea. ?Constipation. ?Inflammatory bowel disease (Crohn disease or ulcerative colitis). ?Infections. ?Anal sex. ?SYMPTOMS ?Symptoms of this condition include: ?Bleeding from the rectum. ?Small amounts of blood seen on your stool, on toilet paper, or in the toilet after a bowel movement. ?Painful bowel movements. ?Itching or irritation around the anus. ?DIAGNOSIS  ?A health care provider may diagnose this condition by closely examining the anal area. An anal fissure can usually be seen with careful inspection. In some cases, a rectal exam may be performed, or a short tube (anoscope) may be used to examine the anal canal. ?TREATMENT ?Treatment for this condition may include: ?Taking steps to avoid constipation. This may include making changes to your diet, such as increasing your intake of fiber or fluid. ?Taking fiber supplements. These supplements can soften your stool to help make bowel movements easier. Your health care provider may  also prescribe a stool softener if your stool is often hard. ?Taking sitz baths. This may help to heal the tear. ?Using medicated creams or ointments. These may be prescribed to lessen discomfort. ?HOME CARE INSTRUCTIONS ?Eating and Drinking ?Avoid foods that may be constipating, such as bananas and dairy products. ?Drink enough fluid to keep your urine clear or pale yellow. ?Maintain a diet that is high in fruits, whole grains, and vegetables. ?General Instructions ?Keep the anal area as clean and dry as possible. ?Take sitz baths as told by your health care provider. Do not use soap in the sitz baths. ?Take over-the-counter and prescription medicines only as told by your health care provider. ?Use creams or ointments only as told by your health care provider. ?Keep all follow-up visits as told by your health care provider. This is important. ?SEEK MEDICAL CARE IF: ?You have more bleeding. ?You have a fever. ?You have diarrhea that is mixed with blood. ?You continue to have pain. ?Your problem is getting worse rather than better. ?  ?This information is not intended to replace advice given to you by your health care provider. Make sure you discuss any questions you have with your health care provider. ?  ?Document Released: 05/16/2005 Document Revised: 02/04/2015 Document Reviewed: 08/11/2014 ?Elsevier Interactive Patient Education ?2016 [place]. ? ?

## 2021-09-20 NOTE — Progress Notes (Signed)
Noted  

## 2021-09-22 DIAGNOSIS — L080 Pyoderma: Secondary | ICD-10-CM | POA: Diagnosis not present

## 2021-09-22 DIAGNOSIS — L565 Disseminated superficial actinic porokeratosis (DSAP): Secondary | ICD-10-CM | POA: Diagnosis not present

## 2021-09-23 DIAGNOSIS — E782 Mixed hyperlipidemia: Secondary | ICD-10-CM | POA: Diagnosis not present

## 2021-09-23 DIAGNOSIS — N182 Chronic kidney disease, stage 2 (mild): Secondary | ICD-10-CM | POA: Diagnosis not present

## 2021-10-05 DIAGNOSIS — M0579 Rheumatoid arthritis with rheumatoid factor of multiple sites without organ or systems involvement: Secondary | ICD-10-CM | POA: Diagnosis not present

## 2021-10-05 DIAGNOSIS — E538 Deficiency of other specified B group vitamins: Secondary | ICD-10-CM | POA: Diagnosis not present

## 2021-10-05 DIAGNOSIS — E782 Mixed hyperlipidemia: Secondary | ICD-10-CM | POA: Diagnosis not present

## 2021-10-05 DIAGNOSIS — N182 Chronic kidney disease, stage 2 (mild): Secondary | ICD-10-CM | POA: Diagnosis not present

## 2021-10-05 DIAGNOSIS — D7589 Other specified diseases of blood and blood-forming organs: Secondary | ICD-10-CM | POA: Diagnosis not present

## 2021-10-05 DIAGNOSIS — M3130 Wegener's granulomatosis without renal involvement: Secondary | ICD-10-CM | POA: Diagnosis not present

## 2021-10-05 DIAGNOSIS — Z23 Encounter for immunization: Secondary | ICD-10-CM | POA: Diagnosis not present

## 2021-10-05 DIAGNOSIS — E039 Hypothyroidism, unspecified: Secondary | ICD-10-CM | POA: Diagnosis not present

## 2021-10-05 DIAGNOSIS — J410 Simple chronic bronchitis: Secondary | ICD-10-CM | POA: Diagnosis not present

## 2021-10-05 DIAGNOSIS — F17210 Nicotine dependence, cigarettes, uncomplicated: Secondary | ICD-10-CM | POA: Diagnosis not present

## 2021-10-14 DIAGNOSIS — L080 Pyoderma: Secondary | ICD-10-CM | POA: Diagnosis not present

## 2021-10-14 DIAGNOSIS — L565 Disseminated superficial actinic porokeratosis (DSAP): Secondary | ICD-10-CM | POA: Diagnosis not present

## 2021-10-26 ENCOUNTER — Other Ambulatory Visit: Payer: Self-pay

## 2021-10-26 ENCOUNTER — Telehealth: Payer: Self-pay | Admitting: Physician Assistant

## 2021-10-26 DIAGNOSIS — R6889 Other general symptoms and signs: Secondary | ICD-10-CM | POA: Diagnosis not present

## 2021-10-26 MED ORDER — DILTIAZEM GEL 2 %: 1.0000 "application " | Freq: Three times a day (TID) | CUTANEOUS | 0 refills | Status: DC

## 2021-10-26 NOTE — Telephone Encounter (Signed)
Patient called to say she was out of the cream that was prescribed for her.  She said it was very expensive ($50) and she wanted to know if there was a less expensive alternative.  She said she is better but not healed and wants to know how long she should expect it to take.  Please call patient on her cell phone and advise. Thank you.

## 2021-10-26 NOTE — Telephone Encounter (Signed)
Spoke with patient regarding PA recommendations. Gel ointment has been sent in to [facility] per pt request. She would like to hold off on colonoscopy for now, and will call back if symptoms continue.

## 2021-10-26 NOTE — Telephone Encounter (Signed)
Patient called in with complaints of ongoing rectal pain. States it is very uncomfortable & she is out of the cream that was prescribed to her at [facility] on 09/20/21. The previous cream was $50, and patient states she cannot afford this. She would like to know if there is an alternative cream that can be ordered that is more affordable.

## 2021-11-25 DIAGNOSIS — M0579 Rheumatoid arthritis with rheumatoid factor of multiple sites without organ or systems involvement: Secondary | ICD-10-CM | POA: Diagnosis not present

## 2021-11-25 DIAGNOSIS — M3130 Wegener's granulomatosis without renal involvement: Secondary | ICD-10-CM | POA: Diagnosis not present

## 2021-11-25 DIAGNOSIS — M170 Bilateral primary osteoarthritis of knee: Secondary | ICD-10-CM | POA: Diagnosis not present

## 2021-11-25 DIAGNOSIS — R5383 Other fatigue: Secondary | ICD-10-CM | POA: Diagnosis not present

## 2021-11-25 DIAGNOSIS — M5136 Other intervertebral disc degeneration, lumbar region: Secondary | ICD-10-CM | POA: Diagnosis not present

## 2021-11-25 DIAGNOSIS — Z79899 Other long term (current) drug therapy: Secondary | ICD-10-CM | POA: Diagnosis not present

## 2021-11-25 DIAGNOSIS — J321 Chronic frontal sinusitis: Secondary | ICD-10-CM | POA: Diagnosis not present

## 2021-12-28 DIAGNOSIS — L565 Disseminated superficial actinic porokeratosis (DSAP): Secondary | ICD-10-CM | POA: Diagnosis not present

## 2021-12-28 DIAGNOSIS — L810 Postinflammatory hyperpigmentation: Secondary | ICD-10-CM | POA: Diagnosis not present

## 2021-12-28 DIAGNOSIS — L905 Scar conditions and fibrosis of skin: Secondary | ICD-10-CM | POA: Diagnosis not present

## 2022-02-24 DIAGNOSIS — M3130 Wegener's granulomatosis without renal involvement: Secondary | ICD-10-CM | POA: Diagnosis not present

## 2022-03-15 DIAGNOSIS — E782 Mixed hyperlipidemia: Secondary | ICD-10-CM | POA: Diagnosis not present

## 2022-03-15 DIAGNOSIS — N182 Chronic kidney disease, stage 2 (mild): Secondary | ICD-10-CM | POA: Diagnosis not present

## 2022-03-15 DIAGNOSIS — Z0000 Encounter for general adult medical examination without abnormal findings: Secondary | ICD-10-CM | POA: Diagnosis not present

## 2022-03-15 DIAGNOSIS — E538 Deficiency of other specified B group vitamins: Secondary | ICD-10-CM | POA: Diagnosis not present

## 2022-03-15 DIAGNOSIS — E039 Hypothyroidism, unspecified: Secondary | ICD-10-CM | POA: Diagnosis not present

## 2022-03-15 DIAGNOSIS — M3130 Wegener's granulomatosis without renal involvement: Secondary | ICD-10-CM | POA: Diagnosis not present

## 2022-03-15 DIAGNOSIS — M0579 Rheumatoid arthritis with rheumatoid factor of multiple sites without organ or systems involvement: Secondary | ICD-10-CM | POA: Diagnosis not present

## 2022-03-18 ENCOUNTER — Encounter: Payer: Self-pay | Admitting: Internal Medicine

## 2022-03-22 DIAGNOSIS — E039 Hypothyroidism, unspecified: Secondary | ICD-10-CM | POA: Diagnosis not present

## 2022-03-22 DIAGNOSIS — F17210 Nicotine dependence, cigarettes, uncomplicated: Secondary | ICD-10-CM | POA: Diagnosis not present

## 2022-03-22 DIAGNOSIS — J410 Simple chronic bronchitis: Secondary | ICD-10-CM | POA: Diagnosis not present

## 2022-03-22 DIAGNOSIS — E538 Deficiency of other specified B group vitamins: Secondary | ICD-10-CM | POA: Diagnosis not present

## 2022-03-22 DIAGNOSIS — M0579 Rheumatoid arthritis with rheumatoid factor of multiple sites without organ or systems involvement: Secondary | ICD-10-CM | POA: Diagnosis not present

## 2022-03-22 DIAGNOSIS — M3130 Wegener's granulomatosis without renal involvement: Secondary | ICD-10-CM | POA: Diagnosis not present

## 2022-03-22 DIAGNOSIS — Z23 Encounter for immunization: Secondary | ICD-10-CM | POA: Diagnosis not present

## 2022-03-22 DIAGNOSIS — D7589 Other specified diseases of blood and blood-forming organs: Secondary | ICD-10-CM | POA: Diagnosis not present

## 2022-03-22 DIAGNOSIS — E782 Mixed hyperlipidemia: Secondary | ICD-10-CM | POA: Diagnosis not present

## 2022-03-22 DIAGNOSIS — N182 Chronic kidney disease, stage 2 (mild): Secondary | ICD-10-CM | POA: Diagnosis not present

## 2022-03-22 DIAGNOSIS — Z0000 Encounter for general adult medical examination without abnormal findings: Secondary | ICD-10-CM | POA: Diagnosis not present

## 2022-05-11 ENCOUNTER — Encounter: Payer: Self-pay | Admitting: Internal Medicine

## 2022-05-26 ENCOUNTER — Other Ambulatory Visit: Payer: Self-pay | Admitting: Internal Medicine

## 2022-05-26 DIAGNOSIS — Z1231 Encounter for screening mammogram for malignant neoplasm of breast: Secondary | ICD-10-CM

## 2022-06-02 DIAGNOSIS — Z79899 Other long term (current) drug therapy: Secondary | ICD-10-CM | POA: Diagnosis not present

## 2022-06-02 DIAGNOSIS — M170 Bilateral primary osteoarthritis of knee: Secondary | ICD-10-CM | POA: Diagnosis not present

## 2022-06-02 DIAGNOSIS — M3130 Wegener's granulomatosis without renal involvement: Secondary | ICD-10-CM | POA: Diagnosis not present

## 2022-06-02 DIAGNOSIS — M0579 Rheumatoid arthritis with rheumatoid factor of multiple sites without organ or systems involvement: Secondary | ICD-10-CM | POA: Diagnosis not present

## 2022-06-02 DIAGNOSIS — J321 Chronic frontal sinusitis: Secondary | ICD-10-CM | POA: Diagnosis not present

## 2022-06-02 DIAGNOSIS — R5383 Other fatigue: Secondary | ICD-10-CM | POA: Diagnosis not present

## 2022-06-02 DIAGNOSIS — M5136 Other intervertebral disc degeneration, lumbar region: Secondary | ICD-10-CM | POA: Diagnosis not present

## 2022-06-09 ENCOUNTER — Ambulatory Visit (AMBULATORY_SURGERY_CENTER): Payer: Medicare Other | Admitting: *Deleted

## 2022-06-09 VITALS — Ht 64.5 in | Wt 185.0 lb

## 2022-06-09 DIAGNOSIS — Z1211 Encounter for screening for malignant neoplasm of colon: Secondary | ICD-10-CM

## 2022-06-09 MED ORDER — NA SULFATE-K SULFATE-MG SULF 17.5-3.13-1.6 GM/177ML PO SOLN: 1.0000 | Freq: Once | ORAL | 0 refills | Status: AC

## 2022-06-09 NOTE — Progress Notes (Signed)

## 2022-06-20 ENCOUNTER — Other Ambulatory Visit: Payer: Self-pay

## 2022-06-20 ENCOUNTER — Encounter: Payer: Self-pay | Admitting: Internal Medicine

## 2022-06-20 ENCOUNTER — Telehealth: Payer: Self-pay | Admitting: Internal Medicine

## 2022-06-20 DIAGNOSIS — Z1211 Encounter for screening for malignant neoplasm of colon: Secondary | ICD-10-CM

## 2022-06-20 MED ORDER — PEG 3350-KCL-NA BICARB-NACL 420 G PO SOLR: 4000.0000 mL | Freq: Once | ORAL | 0 refills | Status: AC

## 2022-06-20 NOTE — Telephone Encounter (Signed)
Inbound call from patient stating that the pharmacy reached out to her and stated that there is some concerns with patient using Suprep for her prep for procedure and is requesting an alternative prep medication be sent in. Patient also stated that Suprep was too expensive and is wanting something cheaper. Please advise.

## 2022-06-28 ENCOUNTER — Encounter: Payer: Self-pay | Admitting: Internal Medicine

## 2022-07-05 ENCOUNTER — Encounter: Payer: Medicare Other | Admitting: Internal Medicine

## 2022-07-07 ENCOUNTER — Ambulatory Visit (AMBULATORY_SURGERY_CENTER): Payer: Medicare Other | Admitting: Internal Medicine

## 2022-07-07 ENCOUNTER — Encounter: Payer: Self-pay | Admitting: Internal Medicine

## 2022-07-07 VITALS — BP 122/77 | HR 73 | Temp 98.6°F | Resp 13 | Ht 64.0 in | Wt 185.0 lb

## 2022-07-07 DIAGNOSIS — D123 Benign neoplasm of transverse colon: Secondary | ICD-10-CM | POA: Diagnosis not present

## 2022-07-07 DIAGNOSIS — Z09 Encounter for follow-up examination after completed treatment for conditions other than malignant neoplasm: Secondary | ICD-10-CM | POA: Diagnosis not present

## 2022-07-07 DIAGNOSIS — Z1211 Encounter for screening for malignant neoplasm of colon: Secondary | ICD-10-CM

## 2022-07-07 DIAGNOSIS — Z860100 Personal history of colon polyps, unspecified: Secondary | ICD-10-CM

## 2022-07-07 DIAGNOSIS — D122 Benign neoplasm of ascending colon: Secondary | ICD-10-CM

## 2022-07-07 MED ORDER — SODIUM CHLORIDE 0.9 % IV SOLN: 500.0000 mL | INTRAVENOUS | Status: DC

## 2022-07-07 NOTE — Op Note (Signed)
[facility] Patient Name: [name] Full Procedure Date: 07/07/2022 11:52 AM MRN: [id number] Endoscopist: Docia Chuck. Henrene Pastor , MD, [id number] Age: 72 Referring MD:  Date of Birth: 1950-09-21 Gender: Female Account #: [id number] Procedure:                Colonoscopy with cold snare polypectomy x 2 Indications:              Screening for colorectal malignant neoplasm Medicines:                Monitored Anesthesia Care Procedure:                Pre-Anesthesia Assessment:                           - Prior to the procedure, a History and Physical                            was performed, and patient medications and                            allergies were reviewed. The patient's tolerance of                            previous anesthesia was also reviewed. The risks                            and benefits of the procedure and the sedation                            options and risks were discussed with the patient.                            All questions were answered, and informed consent                            was obtained. Prior Anticoagulants: The patient has                            taken no anticoagulant or antiplatelet agents. ASA                            Grade Assessment: II - A patient with mild systemic                            disease. After reviewing the risks and benefits,                            the patient was deemed in satisfactory condition to                            undergo the procedure.                           After obtaining informed consent, the colonoscope  was passed under direct vision. Throughout the                            procedure, the patient's blood pressure, pulse, and                            oxygen saturations were monitored continuously. The                            CF [id number] #[id number] was introduced through the anus                            and advanced to the the cecum, identified by                             appendiceal orifice and ileocecal valve. The                            ileocecal valve, appendiceal orifice, and rectum                            were photographed. The quality of the bowel                            preparation was excellent. The colonoscopy was                            performed without difficulty. The patient tolerated                            the procedure well. The bowel preparation used was                            SUPREP via split dose instruction. Scope In: 12:01:12 PM Scope Out: 12:18:16 PM Scope Withdrawal Time: 0 hours 12 minutes 13 seconds  Total Procedure Duration: 0 hours 17 minutes 4 seconds  Findings:                 Two polyps were found in the transverse colon and                            ascending colon. The polyps were 2 to 5 mm in size.                            These polyps were removed with a cold snare.                            Resection and retrieval were complete.                           Multiple diverticula were found in the sigmoid                            colon.  The exam was otherwise without abnormality on                            direct and retroflexion views. Complications:            No immediate complications. Estimated blood loss:                            None. Estimated Blood Loss:     Estimated blood loss: none. Impression:               - Two 2 to 5 mm polyps in the transverse colon and                            in the ascending colon, removed with a cold snare.                            Resected and retrieved.                           - Diverticulosis in the sigmoid colon.                           - The examination was otherwise normal on direct                            and retroflexion views. Recommendation:           - Repeat colonoscopy in 7-10 years for surveillance.                           - Patient has a contact number available for                             emergencies. The signs and symptoms of potential                            delayed complications were discussed with the                            patient. Return to normal activities tomorrow.                            Written discharge instructions were provided to the                            patient.                           - Resume previous diet.                           - Continue present medications.                           - Await pathology results. Docia Chuck. Henrene Pastor, MD 07/07/2022 12:25:07 PM This report has  been signed electronically.

## 2022-07-07 NOTE — Progress Notes (Signed)
Called to room to assist during endoscopic procedure.  Patient ID and intended procedure confirmed with present staff. Received instructions for my participation in the procedure from the performing physician.  

## 2022-07-07 NOTE — Progress Notes (Signed)
Pt resting comfortably. VSS. Airway intact. SBAR complete to RN. All questions answered.   

## 2022-07-07 NOTE — Progress Notes (Signed)
HISTORY OF PRESENT ILLNESS:  [name] is a 72 y.o. female who presents for screening colonoscopy.  Previous examination 2013 was negative for neoplasia  REVIEW OF SYSTEMS:  All non-GI ROS negative except for  Past Medical History:  Diagnosis Date   Diverticulosis    by colonoscopy   Hypothyroidism 1980s   Microhematuria 2000s   s/p normal w/u per patient   Obesity    Rheumatoid arthritis ([place]) 2016   Smoker 05/30/1968   Wegener's granulomatosis 05/31/2007   in remission Darin Engels at [facility])    Past Surgical History:  Procedure Laterality Date   APPENDECTOMY  1980s   CHOLECYSTECTOMY  1980s   COLONOSCOPY  05/31/2011   hyperplastic polyp, diverticulosis rpt 10 yrs Henrene Pastor)   LUNG SURGERY Right    TONSILLECTOMY  child   VIDEO ASSISTED THORACOSCOPY (VATS)/THOROCOTOMY Right 05/31/2007   removal of scar tissue; nodule biopsy x3    Social History Tamekia Rotter  reports that she has been smoking cigarettes. She started smoking about 54 years ago. She has a 40.00 pack-year smoking history. She has never used smokeless tobacco. She reports current alcohol use of about 42.0 standard drinks of alcohol per week. She reports that she does not use drugs.  family history includes Breast cancer in her mother; CAD in her father; Cancer (age of onset: 59) in her mother. She was adopted.  Allergies  Allergen Reactions   Rituxan [Rituximab] Shortness Of Breath   Codeine Other (See Comments)    Chest pain       PHYSICAL EXAMINATION: Vital signs: BP (!) 156/88   Pulse 73   Temp 98.6 F (37 C) (Temporal)   Resp 20   Ht '5\' 4"'$  (1.626 m)   Wt 185 lb (83.9 kg)   SpO2 91%   BMI 31.76 kg/m  General: Well-developed, well-nourished, no acute distress HEENT: Sclerae are anicteric, conjunctiva pink. Oral mucosa intact Lungs: Clear Heart: Regular Abdomen: soft, nontender, nondistended, no obvious ascites, no peritoneal signs, normal bowel sounds. No  organomegaly. Extremities: No edema Psychiatric: alert and oriented x3. Cooperative     ASSESSMENT:  Colon cancer screening   PLAN:   Screening colonoscopy

## 2022-07-07 NOTE — Patient Instructions (Signed)
Handouts provided on polyps and diverticulosis.   Resume previous diet. Continue present medications.  Await pathology results.  Repeat colonoscopy in 7-10 years for surveillance.   YOU HAD AN ENDOSCOPIC PROCEDURE TODAY AT [facility]:   Refer to the procedure report that was given to you for any specific questions about what was found during the examination.  If the procedure report does not answer your questions, please call your gastroenterologist to clarify.  If you requested that your care partner not be given the details of your procedure findings, then the procedure report has been included in a sealed envelope for you to review at your convenience later.  YOU SHOULD EXPECT: Some feelings of bloating in the abdomen. Passage of more gas than usual.  Walking can help get rid of the air that was put into your GI tract during the procedure and reduce the bloating. If you had a lower endoscopy (such as a colonoscopy or flexible sigmoidoscopy) you may notice spotting of blood in your stool or on the toilet paper. If you underwent a bowel prep for your procedure, you may not have a normal bowel movement for a few days.  Please Note:  You might notice some irritation and congestion in your nose or some drainage.  This is from the oxygen used during your procedure.  There is no need for concern and it should clear up in a day or so.  SYMPTOMS TO REPORT IMMEDIATELY:  Following lower endoscopy (colonoscopy or flexible sigmoidoscopy):  Excessive amounts of blood in the stool  Significant tenderness or worsening of abdominal pains  Swelling of the abdomen that is new, acute  Fever of 100F or higher  For urgent or emergent issues, a gastroenterologist can be reached at any hour by calling [phone]. Do not use MyChart messaging for urgent concerns.    DIET:  We do recommend a small meal at first, but then you may proceed to your regular diet.  Drink plenty of fluids but you  should avoid alcoholic beverages for 24 hours.  ACTIVITY:  You should plan to take it easy for the rest of today and you should NOT DRIVE or use heavy machinery until tomorrow (because of the sedation medicines used during the test).    FOLLOW UP: Our staff will call the number listed on your records the next business day following your procedure.  We will call around 7:15- 8:00 am to check on you and address any questions or concerns that you may have regarding the information given to you following your procedure. If we do not reach you, we will leave a message.     If any biopsies were taken you will be contacted by phone or by letter within the next 1-3 weeks.  Please call us at [phone] if you have not heard about the biopsies in 3 weeks.    SIGNATURES/CONFIDENTIALITY: You and/or your care partner have signed paperwork which will be entered into your electronic medical record.  These signatures attest to the fact that that the information above on your After Visit Summary has been reviewed and is understood.  Full responsibility of the confidentiality of this discharge information lies with you and/or your care-partner.

## 2022-07-07 NOTE — Progress Notes (Signed)
Pt's states no medical or surgical changes since previsit or office visit. 

## 2022-07-08 ENCOUNTER — Telehealth: Payer: Self-pay | Admitting: *Deleted

## 2022-07-08 NOTE — Telephone Encounter (Signed)
Attempted to call patient for their post-procedure follow-up call. No answer. Left voicemail.   

## 2022-07-11 ENCOUNTER — Encounter: Payer: Self-pay | Admitting: Internal Medicine

## 2022-07-14 ENCOUNTER — Ambulatory Visit: Payer: Medicare Other

## 2022-08-25 ENCOUNTER — Ambulatory Visit
Admission: RE | Admit: 2022-08-25 | Discharge: 2022-08-25 | Disposition: A | Discharge status: Home / Self Care | Payer: Medicare Other | Source: Ambulatory Visit | Attending: Internal Medicine | Admitting: Internal Medicine

## 2022-08-25 DIAGNOSIS — Z1231 Encounter for screening mammogram for malignant neoplasm of breast: Secondary | ICD-10-CM

## 2022-09-01 DIAGNOSIS — M3130 Wegener's granulomatosis without renal involvement: Secondary | ICD-10-CM | POA: Diagnosis not present

## 2022-11-03 DIAGNOSIS — E039 Hypothyroidism, unspecified: Secondary | ICD-10-CM | POA: Diagnosis not present

## 2022-11-03 DIAGNOSIS — E782 Mixed hyperlipidemia: Secondary | ICD-10-CM | POA: Diagnosis not present

## 2022-11-03 DIAGNOSIS — N182 Chronic kidney disease, stage 2 (mild): Secondary | ICD-10-CM | POA: Diagnosis not present

## 2022-11-03 DIAGNOSIS — D7589 Other specified diseases of blood and blood-forming organs: Secondary | ICD-10-CM | POA: Diagnosis not present

## 2022-11-03 DIAGNOSIS — E538 Deficiency of other specified B group vitamins: Secondary | ICD-10-CM | POA: Diagnosis not present

## 2022-11-17 DIAGNOSIS — M3130 Wegener's granulomatosis without renal involvement: Secondary | ICD-10-CM | POA: Diagnosis not present

## 2022-11-17 DIAGNOSIS — J410 Simple chronic bronchitis: Secondary | ICD-10-CM | POA: Diagnosis not present

## 2022-11-17 DIAGNOSIS — F17210 Nicotine dependence, cigarettes, uncomplicated: Secondary | ICD-10-CM | POA: Diagnosis not present

## 2022-11-17 DIAGNOSIS — M0579 Rheumatoid arthritis with rheumatoid factor of multiple sites without organ or systems involvement: Secondary | ICD-10-CM | POA: Diagnosis not present

## 2022-11-17 DIAGNOSIS — E782 Mixed hyperlipidemia: Secondary | ICD-10-CM | POA: Diagnosis not present

## 2022-11-17 DIAGNOSIS — N182 Chronic kidney disease, stage 2 (mild): Secondary | ICD-10-CM | POA: Diagnosis not present

## 2022-11-17 DIAGNOSIS — E538 Deficiency of other specified B group vitamins: Secondary | ICD-10-CM | POA: Diagnosis not present

## 2022-11-17 DIAGNOSIS — D7589 Other specified diseases of blood and blood-forming organs: Secondary | ICD-10-CM | POA: Diagnosis not present

## 2022-11-17 DIAGNOSIS — E039 Hypothyroidism, unspecified: Secondary | ICD-10-CM | POA: Diagnosis not present

## 2022-12-14 DIAGNOSIS — J321 Chronic frontal sinusitis: Secondary | ICD-10-CM | POA: Diagnosis not present

## 2022-12-14 DIAGNOSIS — M3130 Wegener's granulomatosis without renal involvement: Secondary | ICD-10-CM | POA: Diagnosis not present

## 2022-12-14 DIAGNOSIS — M0579 Rheumatoid arthritis with rheumatoid factor of multiple sites without organ or systems involvement: Secondary | ICD-10-CM | POA: Diagnosis not present

## 2022-12-14 DIAGNOSIS — R5383 Other fatigue: Secondary | ICD-10-CM | POA: Diagnosis not present

## 2022-12-14 DIAGNOSIS — Z79899 Other long term (current) drug therapy: Secondary | ICD-10-CM | POA: Diagnosis not present

## 2022-12-14 DIAGNOSIS — M5136 Other intervertebral disc degeneration, lumbar region: Secondary | ICD-10-CM | POA: Diagnosis not present

## 2022-12-14 DIAGNOSIS — M170 Bilateral primary osteoarthritis of knee: Secondary | ICD-10-CM | POA: Diagnosis not present

## 2023-04-10 DIAGNOSIS — D7589 Other specified diseases of blood and blood-forming organs: Secondary | ICD-10-CM | POA: Diagnosis not present

## 2023-04-10 DIAGNOSIS — N182 Chronic kidney disease, stage 2 (mild): Secondary | ICD-10-CM | POA: Diagnosis not present

## 2023-04-10 DIAGNOSIS — E538 Deficiency of other specified B group vitamins: Secondary | ICD-10-CM | POA: Diagnosis not present

## 2023-04-10 DIAGNOSIS — M0579 Rheumatoid arthritis with rheumatoid factor of multiple sites without organ or systems involvement: Secondary | ICD-10-CM | POA: Diagnosis not present

## 2023-04-10 DIAGNOSIS — Z0000 Encounter for general adult medical examination without abnormal findings: Secondary | ICD-10-CM | POA: Diagnosis not present

## 2023-04-10 DIAGNOSIS — M3130 Wegener's granulomatosis without renal involvement: Secondary | ICD-10-CM | POA: Diagnosis not present

## 2023-04-10 DIAGNOSIS — Z5320 Procedure and treatment not carried out because of patient's decision for unspecified reasons: Secondary | ICD-10-CM | POA: Diagnosis not present

## 2023-04-10 DIAGNOSIS — E782 Mixed hyperlipidemia: Secondary | ICD-10-CM | POA: Diagnosis not present

## 2023-04-10 DIAGNOSIS — J410 Simple chronic bronchitis: Secondary | ICD-10-CM | POA: Diagnosis not present

## 2023-04-10 DIAGNOSIS — E039 Hypothyroidism, unspecified: Secondary | ICD-10-CM | POA: Diagnosis not present

## 2023-04-10 DIAGNOSIS — F17210 Nicotine dependence, cigarettes, uncomplicated: Secondary | ICD-10-CM | POA: Diagnosis not present

## 2023-07-04 DIAGNOSIS — M0579 Rheumatoid arthritis with rheumatoid factor of multiple sites without organ or systems involvement: Secondary | ICD-10-CM | POA: Diagnosis not present

## 2023-07-04 DIAGNOSIS — M3130 Wegener's granulomatosis without renal involvement: Secondary | ICD-10-CM | POA: Diagnosis not present

## 2023-07-04 DIAGNOSIS — M5136 Other intervertebral disc degeneration, lumbar region with discogenic back pain only: Secondary | ICD-10-CM | POA: Diagnosis not present

## 2023-07-04 DIAGNOSIS — M170 Bilateral primary osteoarthritis of knee: Secondary | ICD-10-CM | POA: Diagnosis not present

## 2023-07-04 DIAGNOSIS — R5383 Other fatigue: Secondary | ICD-10-CM | POA: Diagnosis not present

## 2023-07-04 DIAGNOSIS — Z79899 Other long term (current) drug therapy: Secondary | ICD-10-CM | POA: Diagnosis not present

## 2023-10-16 ENCOUNTER — Ambulatory Visit
Admission: RE | Admit: 2023-10-16 | Discharge: 2023-10-16 | Disposition: A | Discharge status: Home / Self Care | Source: Ambulatory Visit | Attending: Internal Medicine | Admitting: Internal Medicine

## 2023-10-16 ENCOUNTER — Other Ambulatory Visit: Payer: Self-pay | Admitting: Internal Medicine

## 2023-10-16 DIAGNOSIS — R059 Cough, unspecified: Secondary | ICD-10-CM | POA: Diagnosis not present

## 2023-10-16 DIAGNOSIS — R051 Acute cough: Secondary | ICD-10-CM

## 2023-10-16 DIAGNOSIS — R0602 Shortness of breath: Secondary | ICD-10-CM | POA: Diagnosis not present

## 2023-10-16 DIAGNOSIS — R5383 Other fatigue: Secondary | ICD-10-CM | POA: Diagnosis not present

## 2023-12-11 DIAGNOSIS — J449 Chronic obstructive pulmonary disease, unspecified: Secondary | ICD-10-CM | POA: Diagnosis not present

## 2023-12-11 DIAGNOSIS — N182 Chronic kidney disease, stage 2 (mild): Secondary | ICD-10-CM | POA: Diagnosis not present

## 2023-12-11 DIAGNOSIS — M3130 Wegener's granulomatosis without renal involvement: Secondary | ICD-10-CM | POA: Diagnosis not present

## 2023-12-11 DIAGNOSIS — F17210 Nicotine dependence, cigarettes, uncomplicated: Secondary | ICD-10-CM | POA: Diagnosis not present

## 2023-12-11 DIAGNOSIS — E782 Mixed hyperlipidemia: Secondary | ICD-10-CM | POA: Diagnosis not present

## 2023-12-11 DIAGNOSIS — M0579 Rheumatoid arthritis with rheumatoid factor of multiple sites without organ or systems involvement: Secondary | ICD-10-CM | POA: Diagnosis not present

## 2023-12-11 DIAGNOSIS — E538 Deficiency of other specified B group vitamins: Secondary | ICD-10-CM | POA: Diagnosis not present

## 2023-12-11 DIAGNOSIS — E039 Hypothyroidism, unspecified: Secondary | ICD-10-CM | POA: Diagnosis not present

## 2023-12-11 DIAGNOSIS — J410 Simple chronic bronchitis: Secondary | ICD-10-CM | POA: Diagnosis not present

## 2023-12-11 DIAGNOSIS — Z5320 Procedure and treatment not carried out because of patient's decision for unspecified reasons: Secondary | ICD-10-CM | POA: Diagnosis not present

## 2023-12-11 DIAGNOSIS — D7589 Other specified diseases of blood and blood-forming organs: Secondary | ICD-10-CM | POA: Diagnosis not present

## 2024-01-01 DIAGNOSIS — J321 Chronic frontal sinusitis: Secondary | ICD-10-CM | POA: Diagnosis not present

## 2024-01-01 DIAGNOSIS — R5383 Other fatigue: Secondary | ICD-10-CM | POA: Diagnosis not present

## 2024-01-01 DIAGNOSIS — M170 Bilateral primary osteoarthritis of knee: Secondary | ICD-10-CM | POA: Diagnosis not present

## 2024-01-01 DIAGNOSIS — M5136 Other intervertebral disc degeneration, lumbar region with discogenic back pain only: Secondary | ICD-10-CM | POA: Diagnosis not present

## 2024-01-01 DIAGNOSIS — M0579 Rheumatoid arthritis with rheumatoid factor of multiple sites without organ or systems involvement: Secondary | ICD-10-CM | POA: Diagnosis not present

## 2024-01-01 DIAGNOSIS — J438 Other emphysema: Secondary | ICD-10-CM | POA: Diagnosis not present

## 2024-01-01 DIAGNOSIS — Z79899 Other long term (current) drug therapy: Secondary | ICD-10-CM | POA: Diagnosis not present

## 2024-01-01 DIAGNOSIS — M3130 Wegener's granulomatosis without renal involvement: Secondary | ICD-10-CM | POA: Diagnosis not present

## 2024-02-11 DIAGNOSIS — H5711 Ocular pain, right eye: Secondary | ICD-10-CM | POA: Diagnosis not present

## 2024-02-14 DIAGNOSIS — H5711 Ocular pain, right eye: Secondary | ICD-10-CM | POA: Diagnosis not present

## 2024-02-21 DIAGNOSIS — H5711 Ocular pain, right eye: Secondary | ICD-10-CM | POA: Diagnosis not present

## 2024-02-22 ENCOUNTER — Emergency Department (HOSPITAL_COMMUNITY)

## 2024-02-22 ENCOUNTER — Other Ambulatory Visit: Payer: Self-pay

## 2024-02-22 ENCOUNTER — Emergency Department (HOSPITAL_COMMUNITY)
Admission: EM | Admit: 2024-02-22 | Discharge: 2024-02-22 | Disposition: A | Discharge status: Home / Self Care | Attending: Emergency Medicine | Admitting: Emergency Medicine

## 2024-02-22 DIAGNOSIS — F17210 Nicotine dependence, cigarettes, uncomplicated: Secondary | ICD-10-CM | POA: Insufficient documentation

## 2024-02-22 DIAGNOSIS — R519 Headache, unspecified: Secondary | ICD-10-CM | POA: Diagnosis not present

## 2024-02-22 DIAGNOSIS — L03213 Periorbital cellulitis: Secondary | ICD-10-CM | POA: Diagnosis not present

## 2024-02-22 DIAGNOSIS — E039 Hypothyroidism, unspecified: Secondary | ICD-10-CM | POA: Insufficient documentation

## 2024-02-22 DIAGNOSIS — H5711 Ocular pain, right eye: Secondary | ICD-10-CM | POA: Insufficient documentation

## 2024-02-22 LAB — CBC WITH DIFFERENTIAL/PLATELET
Abs Immature Granulocytes: 0.02 K/uL (ref 0.00–0.07)
Basophils Absolute: 0.1 K/uL (ref 0.0–0.1)
Basophils Relative: 1 %
Eosinophils Absolute: 0 K/uL (ref 0.0–0.5)
Eosinophils Relative: 0 %
HCT: 43.6 % (ref 36.0–46.0)
Hemoglobin: 14.6 g/dL (ref 12.0–15.0)
Immature Granulocytes: 0 %
Lymphocytes Relative: 11 %
Lymphs Abs: 0.9 K/uL (ref 0.7–4.0)
MCH: 35.7 pg — ABNORMAL HIGH (ref 26.0–34.0)
MCHC: 33.5 g/dL (ref 30.0–36.0)
MCV: 106.6 fL — ABNORMAL HIGH (ref 80.0–100.0)
Monocytes Absolute: 0.3 K/uL (ref 0.1–1.0)
Monocytes Relative: 4 %
Neutro Abs: 6.9 K/uL (ref 1.7–7.7)
Neutrophils Relative %: 84 %
Platelets: 261 K/uL (ref 150–400)
RBC: 4.09 MIL/uL (ref 3.87–5.11)
RDW: 12.4 % (ref 11.5–15.5)
WBC: 8.2 K/uL (ref 4.0–10.5)
nRBC: 0 % (ref 0.0–0.2)

## 2024-02-22 LAB — SEDIMENTATION RATE: Sed Rate: 1 mm/h (ref 0–22)

## 2024-02-22 LAB — BASIC METABOLIC PANEL WITH GFR
Anion gap: 10 (ref 5–15)
BUN: <5 mg/dL — ABNORMAL LOW (ref 8–23)
CO2: 29 mmol/L (ref 22–32)
Calcium: 8.8 mg/dL — ABNORMAL LOW (ref 8.9–10.3)
Chloride: 100 mmol/L (ref 98–111)
Creatinine, Ser: 0.76 mg/dL (ref 0.44–1.00)
GFR, Estimated: >60 mL/min (ref >60)
Glucose, Bld: 119 mg/dL — ABNORMAL HIGH (ref 70–99)
Potassium: 4.3 mmol/L (ref 3.5–5.1)
Sodium: 139 mmol/L (ref 135–145)

## 2024-02-22 LAB — C-REACTIVE PROTEIN: CRP: 0.5 mg/dL (ref <1.0)

## 2024-02-22 MED ORDER — GADOBUTROL 1 MMOL/ML IV SOLN
8.0000 mL | Freq: Once | INTRAVENOUS | Status: AC | PRN
  Administered 2024-02-22: 8 mL via INTRAVENOUS

## 2024-02-22 MED ORDER — LORAZEPAM 2 MG/ML IJ SOLN
0.5000 mg | Freq: Once | INTRAMUSCULAR | Status: AC
  Administered 2024-02-22: 0.5 mg via INTRAVENOUS
  Filled 2024-02-22: qty 1

## 2024-02-22 MED ORDER — LORAZEPAM 1 MG PO TABS: 0.5000 mg | ORAL_TABLET | Freq: Once | ORAL | Status: DC

## 2024-02-22 MED ORDER — TETRACAINE HCL 0.5 % OP SOLN
1.0000 [drp] | Freq: Once | OPHTHALMIC | Status: AC
  Administered 2024-02-22: 1 [drp] via OPHTHALMIC
  Filled 2024-02-22: qty 4

## 2024-02-22 NOTE — ED Provider Notes (Signed)
 Received patient in turnover from Dr. Francesca.  Please see their note for further details of Hx, PE.  Briefly patient is a 73 y.o. female with a Eye Pain .  Patient sent by her optomitrist for MRI.  Awaiting MRI.SABRA  MRI of the brain and orbit without obvious acute finding.  I discussed this with the patient.  Encouraged her to follow-up with ophthalmology in the office.    Emil Share, DO 02/22/24 [phone]

## 2024-02-22 NOTE — ED Triage Notes (Signed)
 PT present with right eye pain x 3 days. Ophthalmologist advised to come to ER for CT scan. Pt denies any blurred vision. Denies injury. Has been using drops prescribed by eye doctor but pain has only slightly improved. Denies headache

## 2024-02-22 NOTE — ED Provider Notes (Signed)
 [place] [facility] Provider Note  CSN: [id number] Arrival date & time: 02/22/24 9165  Chief Complaint(s) Eye Pain  HPI [name] is a 73 y.o. female history of rheumatoid arthritis presenting to the emergency department with eye pain.  Patient reports right eye pain for 3 weeks.  She denies any headaches.  She denies any changes in her vision.  She just reports that it hurts.  She denies trauma to her eye.  Has been seeing an optometrist who started on prednisolone gtt which maybe helped a little bit.  Optometrist apparently saw her again today and said there is nothing wrong with her eyeball or posterior orbit and so she might have something going on in her brain and needed further testing.  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 Authorizing Provider  acetaminophen  (TYLENOL ) 500 MG tablet Take 1,000 mg by mouth every 6 (six) hours as needed (pain).    [provider]  Biotin 800 MCG TABS 1 tablet Orally Once a day for 30 day(s)    [provider]  Cholecalciferol (VITAMIN D) 2000 UNITS tablet Take 2,000 Units by mouth daily.    [provider]  Cyanocobalamin  1000 MCG TBCR 1 tablet Orally Once a day    [provider]  diltiazem  2 % GEL Apply 1 application. topically 3 (three) times daily. 10/26/21 11/25/21  Craig Alan SAUNDERS, PA-C  folic acid (FOLVITE) 1 MG tablet Take 3 mg by mouth daily. 05/13/22   [provider]  levothyroxine  (SYNTHROID , LEVOTHROID) 112 MCG tablet Take one tablet every day. **MUST HAVE PHYSICAL FOR FURTHER REFILLS** 04/21/14   Rilla Baller, MD  methotrexate (RHEUMATREX) 5 MG tablet Take 30 mg by mouth once a week. Caution: Chemotherapy. Protect from light.    [provider]  NON FORMULARY Diltiazem  2%/Lidocaine compound Use 3 x rectally daily for 2 months to heal anal fissure Patient not taking: Reported on 06/09/2022 09/20/21   Craig Alan SAUNDERS, PA-C  polyethylene glycol-electrolytes (NULYTELY) 420 g solution Take 4,000 mLs by mouth once. 06/20/22   [provider]  predniSONE  (DELTASONE ) 20 MG tablet One daily with food Patient taking differently: Take 5 mg by mouth daily. 03/18/16   Mario Million, MD  sulfamethoxazole-trimethoprim (BACTRIM) 400-80 MG tablet Take 2 tablets by mouth daily.    [provider]  Turmeric (QC TUMERIC COMPLEX PO) Take 1,000 mg by mouth daily.    [provider]  valACYclovir (VALTREX) 500 MG tablet Take 500 mg by mouth daily.    [provider]  Past Surgical History Past Surgical History:  Procedure Laterality Date   APPENDECTOMY  1980s   CHOLECYSTECTOMY  1980s   COLONOSCOPY  05/31/2011   hyperplastic polyp, diverticulosis rpt 10 yrs Oletta)   LUNG SURGERY Right    TONSILLECTOMY  child   VIDEO ASSISTED THORACOSCOPY (VATS)/THOROCOTOMY Right 05/31/2007   removal of scar tissue; nodule biopsy x3   Family History Family History  Adopted: Yes  Problem Relation Age of Onset   Cancer Mother 81       breast   Breast cancer Mother    CAD Father         MI   Colon cancer Neg Hx    Colon polyps Neg Hx    Crohn's disease Neg Hx    Esophageal cancer Neg Hx    Rectal cancer Neg Hx    Stomach cancer Neg Hx    Ulcerative colitis Neg Hx     Social History Social History   Tobacco Use   Smoking status: Every Day    Current packs/day: 1.00    Average packs/day: 1 pack/day for 55.7 years (55.7 ttl pk-yrs)    Types: Cigarettes    Start date: 05/30/1968   Smokeless tobacco: Never  Vaping Use   Vaping status: Never Used  Substance Use Topics   Alcohol use: Yes    Alcohol/week: 42.0 standard drinks of alcohol    Types: 42 Cans of beer per week    Comment: beer 24 a week   Drug use: No   Allergies Rituxan [rituximab] and Codeine  Review of Systems Review of Systems  All other systems reviewed and are negative.   Physical Exam Vital Signs  I have reviewed the triage vital signs BP 127/73   Pulse 85   Temp 98 F (36.7 C)   Resp 18   Wt 83.9 kg   SpO2 98%   BMI 31.75 kg/m  Physical Exam Vitals and nursing note reviewed.  Constitutional:      General: She is not in acute distress.    Appearance: She is well-developed.  HENT:     Head: Normocephalic and atraumatic.     Mouth/Throat:     Mouth: Mucous membranes are moist.  Eyes:     Intraocular pressure: Right eye pressure is 19 mmHg. Left eye pressure is 20 mmHg. Measurements were taken using a handheld tonometer.    Pupils: Pupils are equal, round, and reactive to light.  Cardiovascular:     Rate and Rhythm: Normal rate and regular rhythm.     Heart sounds: No murmur heard. Pulmonary:     Effort: Pulmonary effort is normal. No respiratory distress.     Breath sounds: Normal breath sounds.  Abdominal:     General: Abdomen is flat.     Palpations: Abdomen is soft.     Tenderness: There is no abdominal tenderness.  Musculoskeletal:        General: No tenderness.     Right lower leg: No edema.     Left lower leg: No edema.  Skin:    General: Skin is warm and dry.   Neurological:     General: No focal deficit present.     Mental Status: She is alert. Mental status is at baseline.     Comments: CN2-12 intact   Psychiatric:        Mood and Affect: Mood normal.        Behavior: Behavior normal.     ED Results and Treatments Labs (all  labs ordered are listed, but only abnormal results are displayed) Labs Reviewed  BASIC METABOLIC PANEL WITH GFR - Abnormal; Notable for the following components:      Result Value   Glucose, Bld 119 (*)    BUN <5 (*)    Calcium 8.8 (*)    All other components within normal limits  CBC WITH DIFFERENTIAL/PLATELET - Abnormal; Notable for the following components:   MCV 106.6 (*)    MCH 35.7 (*)    All other components within normal limits  SEDIMENTATION RATE  C-REACTIVE PROTEIN                                                                                                                          Radiology No results found.  Pertinent labs & imaging results that were available during my care of the patient were reviewed by me and considered in my medical decision making (see MDM for details).  Medications Ordered in ED Medications  tetracaine  (PONTOCAINE) 0.5 % ophthalmic solution 1 drop (1 drop Both Eyes Given by Other 02/22/24 1236)  LORazepam  (ATIVAN ) injection 0.5 mg (0.5 mg Intravenous Given 02/22/24 1546)                                                                                                                                     Procedures Procedures  (including critical care time)  Medical Decision Making / ED Course   MDM:  73 year old presenting to the emergency department with right eye pain.  Patient is overall well-appearing, neurologic exam without focal abnormality.  Unclear cause of symptoms.  Patient reportedly had normal eye exam at optometrist.  Differential includes intracranial causes or other unusual cause such as optic neuritis.  Will obtain MRIs.  Lower concern for process  like temporal arteritis but will check labs including ESR and CRP.  If testing is overall reassuring, likely will discharge patient for follow-up with ophthalmologist.  Clinical Course as of 02/22/24 1619  Thu Feb 22, 2024  1617 Normal ESR and CRP. Pending MRI. Signed out to Dr. Emil. If negative likely discharge [WS]    Clinical Course User Index [WS] Francesca Elsie CROME, MD     Additional history obtained: -External records from outside source obtained and reviewed including: Chart review including previous notes, labs, imaging, consultation notes including prior notes    Lab Tests: -I ordered, reviewed, and interpreted labs.  The pertinent results include:   Labs Reviewed  BASIC METABOLIC PANEL WITH GFR - Abnormal; Notable for the following components:      Result Value   Glucose, Bld 119 (*)    BUN <5 (*)    Calcium 8.8 (*)    All other components within normal limits  CBC WITH DIFFERENTIAL/PLATELET - Abnormal; Notable for the following components:   MCV 106.6 (*)    MCH 35.7 (*)    All other components within normal limits  SEDIMENTATION RATE  C-REACTIVE PROTEIN      Medicines ordered and prescription drug management: Meds ordered this encounter  Medications   tetracaine  (PONTOCAINE) 0.5 % ophthalmic solution 1 drop   DISCONTD: LORazepam  (ATIVAN ) tablet 0.5 mg   LORazepam  (ATIVAN ) injection 0.5 mg    -I have reviewed the patients home medicines and have made adjustments as needed   Co morbidities that complicate the patient evaluation  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 (Rheum Anderson at [facility])      Dispostion: Disposition decision including need for hospitalization was considered, and patient disposition pending at time  of sign out.    Final Clinical Impression(s) / ED Diagnoses Final diagnoses:  Eye pain, right     This chart was dictated using voice recognition software.  Despite best efforts to proofread,  errors can occur which can change the documentation meaning.    Francesca Elsie CROME, MD 02/22/24 [id number]

## 2024-02-22 NOTE — ED Notes (Signed)
 MRI came to get patient. Pt asked for her IV to be removed and needs a new one for MRI. Pt also needs sedation medication to be ordered by the doctor.

## 2024-02-22 NOTE — ED Notes (Signed)
 Pt states she wants to go outside. Explained to patient that she has an IV and cannot go outside with an IV. Pt states she will not take long. Explained to patient that she is not allowed to go outside with IV intact. Pt request this RN to take her IV out. Also explained to patient that we cannot necessary hold her room for her while she is gone. Explained that many people are waiting for a room and we cannot hold a room for her if she is not in there. Pt states she just needs to go to her car and come back. Explained to patient that I could give her about 10 minutes but I could not hold it for long if she does not come back. Pt IV removed as requested.

## 2024-02-22 NOTE — Discharge Instructions (Signed)
 Please follow-up with the eye doctor in clinic.  Please let your family doctor know about your visit here as well.

## 2024-02-23 DIAGNOSIS — H5711 Ocular pain, right eye: Secondary | ICD-10-CM | POA: Diagnosis not present

## 2024-02-23 DIAGNOSIS — H353132 Nonexudative age-related macular degeneration, bilateral, intermediate dry stage: Secondary | ICD-10-CM | POA: Diagnosis not present

## 2024-02-23 DIAGNOSIS — H2000 Unspecified acute and subacute iridocyclitis: Secondary | ICD-10-CM | POA: Diagnosis not present

## 2024-02-26 DIAGNOSIS — H2000 Unspecified acute and subacute iridocyclitis: Secondary | ICD-10-CM | POA: Diagnosis not present

## 2024-02-26 DIAGNOSIS — M0579 Rheumatoid arthritis with rheumatoid factor of multiple sites without organ or systems involvement: Secondary | ICD-10-CM | POA: Diagnosis not present

## 2024-02-26 DIAGNOSIS — M170 Bilateral primary osteoarthritis of knee: Secondary | ICD-10-CM | POA: Diagnosis not present

## 2024-02-26 DIAGNOSIS — M3130 Wegener's granulomatosis without renal involvement: Secondary | ICD-10-CM | POA: Diagnosis not present

## 2024-02-26 DIAGNOSIS — J321 Chronic frontal sinusitis: Secondary | ICD-10-CM | POA: Diagnosis not present

## 2024-02-26 DIAGNOSIS — Z79899 Other long term (current) drug therapy: Secondary | ICD-10-CM | POA: Diagnosis not present

## 2024-02-26 DIAGNOSIS — J438 Other emphysema: Secondary | ICD-10-CM | POA: Diagnosis not present

## 2024-02-26 DIAGNOSIS — M5136 Other intervertebral disc degeneration, lumbar region with discogenic back pain only: Secondary | ICD-10-CM | POA: Diagnosis not present

## 2024-02-26 DIAGNOSIS — R5383 Other fatigue: Secondary | ICD-10-CM | POA: Diagnosis not present

## 2024-03-13 ENCOUNTER — Ambulatory Visit: Admitting: Emergency Medicine

## 2024-03-13 ENCOUNTER — Ambulatory Visit

## 2024-03-15 ENCOUNTER — Ambulatory Visit: Payer: Self-pay | Admitting: Internal Medicine

## 2024-03-15 NOTE — Telephone Encounter (Signed)
 FYI Only or Action Required?: FYI only for provider.  Patient will be a new patient at Pulmonary.  Called Nurse Triage reporting Nasal Congestion.  Triage Disposition: See PCP When Office is Open (Within 3 Days)  Patient/caregiver understands and will follow disposition?: Yes (scheduled for first available to establish care on 10/28)       Copied from CRM #[id number]. Topic: Clinical - Red Word Triage >> Mar 15, 2024  8:48 AM Corean SAUNDERS wrote: Red Word that prompted transfer to Nurse Triage: Severe Mucus Reason for Disposition  [1] Sinus congestion (pressure, fullness) AND [2] present > 10 days  Answer Assessment - Initial Assessment Questions This RN scheduled pt for first available appointment to establish care. This RN educated pt on new-worsening symptoms and when to call back/seek emergent care. Pt verbalized understanding and agrees to plan   Congestion- clear in color Pt states she is in the beginning stages of emphysema according to pt PCP Pt had an appt this week but had to cancel after a fall this week Denies sinus pain, fever, difficulty breathing (except when choking on mucous) Denies having a pulse oximeter  Denies chest pain  Protocols used: Sinus Pain or Congestion-A-AH

## 2024-03-19 DIAGNOSIS — H353132 Nonexudative age-related macular degeneration, bilateral, intermediate dry stage: Secondary | ICD-10-CM | POA: Diagnosis not present

## 2024-03-19 DIAGNOSIS — H2000 Unspecified acute and subacute iridocyclitis: Secondary | ICD-10-CM | POA: Diagnosis not present

## 2024-03-26 ENCOUNTER — Ambulatory Visit: Admitting: Internal Medicine

## 2024-03-26 ENCOUNTER — Encounter: Payer: Self-pay | Admitting: Internal Medicine

## 2024-03-26 VITALS — BP 114/77 | HR 81 | Temp 98.4°F | Ht 64.0 in | Wt 163.0 lb

## 2024-03-26 DIAGNOSIS — M5136 Other intervertebral disc degeneration, lumbar region with discogenic back pain only: Secondary | ICD-10-CM | POA: Diagnosis not present

## 2024-03-26 DIAGNOSIS — R053 Chronic cough: Secondary | ICD-10-CM

## 2024-03-26 DIAGNOSIS — M0579 Rheumatoid arthritis with rheumatoid factor of multiple sites without organ or systems involvement: Secondary | ICD-10-CM | POA: Diagnosis not present

## 2024-03-26 DIAGNOSIS — R0609 Other forms of dyspnea: Secondary | ICD-10-CM | POA: Diagnosis not present

## 2024-03-26 DIAGNOSIS — R5383 Other fatigue: Secondary | ICD-10-CM | POA: Diagnosis not present

## 2024-03-26 DIAGNOSIS — H2000 Unspecified acute and subacute iridocyclitis: Secondary | ICD-10-CM | POA: Diagnosis not present

## 2024-03-26 DIAGNOSIS — Z79899 Other long term (current) drug therapy: Secondary | ICD-10-CM | POA: Diagnosis not present

## 2024-03-26 DIAGNOSIS — M170 Bilateral primary osteoarthritis of knee: Secondary | ICD-10-CM | POA: Diagnosis not present

## 2024-03-26 DIAGNOSIS — F17210 Nicotine dependence, cigarettes, uncomplicated: Secondary | ICD-10-CM

## 2024-03-26 DIAGNOSIS — R634 Abnormal weight loss: Secondary | ICD-10-CM | POA: Diagnosis not present

## 2024-03-26 DIAGNOSIS — M3130 Wegener's granulomatosis without renal involvement: Secondary | ICD-10-CM | POA: Diagnosis not present

## 2024-03-26 MED ORDER — OXYCODONE-ACETAMINOPHEN 10-325 MG PO TABS: 1.0000 | ORAL_TABLET | Freq: Four times a day (QID) | ORAL | 0 refills | Status: DC | PRN

## 2024-03-26 MED ORDER — FAMOTIDINE 20 MG PO TABS: ORAL_TABLET | ORAL | 11 refills | Status: DC

## 2024-03-26 MED ORDER — PANTOPRAZOLE SODIUM 40 MG PO TBEC: 40.0000 mg | DELAYED_RELEASE_TABLET | Freq: Every day | ORAL | 2 refills | Status: DC

## 2024-03-26 NOTE — Progress Notes (Signed)
 [name], female    DOB: 22-Oct-1950   MRN: [id number]   Brief patient profile:  49  yowf active smoker with with whooping cough age 73 but 100 %  referred to pulmonary clinic 03/26/2024 by Dr Verdia  for doe x 5 y and cough x 10 years recurrent bronchitis rx abx and no prednisone  or inhalers and lasted a week or two at most but referred now with onset mid Sept 2025       Pt not previously seen by [facility] service.    Wegeners around 2010 /baseline pred 5 / methotrexate/ humira   History of Present Illness  03/26/2024  Pulmonary/ 1st office eval/[name]   Dyspnea:  no worse than usual/ no HC parking / a pace is a little slower Cough: mucoid x 30 min to clear  Sleep: baseline 30 degrees x years  and with onset of most nights feels choked > tsp  SABA use: none  02 ldz:wnwz     No obvious day to day or daytime pattern/variability or assoc excess/ purulent sputum or mucus plugs or hemoptysis or cp or chest tightness, subjective wheeze or overt sinus or hb symptoms.    Also denies any obvious fluctuation of symptoms with weather or environmental changes or other aggravating or alleviating factors except as outlined above   No unusual exposure hx or h/o childhood pna/ asthma or knowledge of premature birth.  Current Allergies, Complete Past Medical History, Past Surgical History, Family History, and Social History were reviewed in [employer] record.  ROS  The following are not active complaints unless bolded Hoarseness, sore throat, dysphagia, dental problems, itching, sneezing,  nasal congestion or discharge of excess mucus or purulent secretions, ear ache,   fever, chills, sweats, unintended wt loss or wt gain, classically pleuritic or exertional cp,  orthopnea pnd or arm/hand swelling  or leg swelling, presyncope, palpitations, abdominal pain, anorexia, nausea, vomiting, diarrhea  or change in bowel habits or change in bladder habits, change in stools or  change in urine, dysuria, hematuria,  rash, arthralgias, visual complaints, headache, numbness, weakness or ataxia or problems with walking or coordination,  change in mood or  memory.             Outpatient Medications Prior to Visit  Medication Sig Dispense Refill   acetaminophen  (TYLENOL ) 500 MG tablet Take 1,000 mg by mouth every 6 (six) hours as needed (pain).     Biotin 800 MCG TABS 1 tablet Orally Once a day for 30 day(s)     Cholecalciferol (VITAMIN D) 2000 UNITS tablet Take 2,000 Units by mouth daily.     Cyanocobalamin  1000 MCG TBCR 1 tablet Orally Once a day     folic acid (FOLVITE) 1 MG tablet Take 3 mg by mouth daily.     levothyroxine  (SYNTHROID , LEVOTHROID) 112 MCG tablet Take one tablet every day. **MUST HAVE PHYSICAL FOR FURTHER REFILLS** 30 tablet 0   methotrexate (RHEUMATREX) 5 MG tablet Take 30 mg by mouth once a week. Caution: Chemotherapy. Protect from light.     polyethylene glycol-electrolytes (NULYTELY) 420 g solution Take 4,000 mLs by mouth once.     predniSONE  (DELTASONE ) 20 MG tablet One daily with food 5 tablet 0   sulfamethoxazole-trimethoprim (BACTRIM) 400-80 MG tablet Take 2 tablets by mouth daily.     Turmeric (QC TUMERIC COMPLEX PO) Take 1,000 mg by mouth daily.     valACYclovir (VALTREX) 500 MG tablet Take 500 mg by mouth daily.  diltiazem  2 % GEL Apply 1 application. topically 3 (three) times daily. (Patient not taking: Reported on 03/26/2024) 90 g 0   NON FORMULARY Diltiazem  2%/Lidocaine compound Use 3 x rectally daily for 2 months to heal anal fissure (Patient not taking: Reported on 03/26/2024) 30 g 1   No facility-administered medications prior to visit.    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 ([facility] at  [facility])      Objective:     BP 114/77   Pulse 81   Temp 98.4 F (36.9 C) (Oral)   Ht 5' 4 (1.626 m)   Wt 163 lb (73.9 kg)   SpO2 98%   BMI 27.98 kg/m   SpO2: 98 % RA  Somber amb wf / upper airway cough pattern    HEENT : Oropharynx  clear      Nasal turbinates nl    NECK :  without  apparent JVD/ palpable Nodes/TM - prominent pseudowheeze    LUNGS: no acc muscle use,  Nl contour chest with noisy upper airway souds transmitted thruout the upper > lower airways    CV:  RRR  no s3 or murmur or increase in P2, and no edema   ABD:  soft and nontender   MS:  Gait nl   ext warm without deformities Or obvious joint restrictions  calf tenderness, cyanosis or clubbing    SKIN: warm and dry without lesions    NEURO:  alert, approp, nl sensorium with  no motor or cerebellar deficits apparent.   Cxr per PCP shows early emphysema not available to review    Assessment    Assessment & Plan Chronic cough Smoker with onset early sept 2025 - MRI sinus nl 02/12/24  - 03/26/2024 prominent upper airway wheezing so rx as UACS with cough elimination / max gerd rx >>>  Of the three most common causes of  Sub-acute / recurrent or chronic cough, only one (GERD)  can actually contribute to/ trigger  the other two (asthma and post nasal drip syndrome)  and perpetuate the cylce of cough.  While not intuitively obvious, many patients with chronic low grade reflux do not cough until there is a primary insult that disturbs the protective epithelial barrier and exposes sensitive nerve endings.   This is typically viral but can due to PNDS and  either may apply here.    >>>  The point is that once this occurs, it is difficult to eliminate the cycle  using anything but a maximally effective acid suppression regimen at least in the short run, accompanied by an appropriate diet to address non acid GERD and control / eliminate the cough itself for at least 3 days with percocet up to 1 q4h and f/u by ENT  prn     DOE (dyspnea on exertion) Onset reported around 2010 - PFT's  12/04/2007  FEV1 1.34  (57 % ) ratio 0.73    with DLCO  15.6 (64%)   and FV curve min concave   @ wt 188  No evidence of copd on prior pfts well w/in her range of symptom onset so nothing needed here until we clear up her cough as most inhalers, as she has found out, make cough  worse.    Cigarette smoker 5  min discussion re active cigarette smoking in addition to office E&M  Ask about tobacco use:   ongoing Advise quitting     I reviewed the Fletcher curve with the patient that basically indicates that  if you quit smoking when your best day FEV1 is still well preserved (as is likely still   the case here)  it is highly unlikely you will progress to severe disease and informed the patient there was  no medication on the market that has proven to alter the curve/ its downward trajectory  or the likelihood of progression of their disease(unlike other chronic medical conditions such as atheroclerosis where we do think we can change the natural hx with risk reducing meds)    Therefore stopping smoking and maintaining abstinence are  the most important aspects of her care, not choice of inhalers or for that matter, Pulmonary doctors.  Treatment other than smoking cessation  is entirely directed by severity of symptoms and focused also on reducing exacerbations, not attempting to change the natural history of the disease. Should either issue become problematic, then escalation of maintenance and prn resp meds may be warranted the punishment needs to fit the crime   Assess willingness:  Not committed at this point Assist in quit attempt:  Per PCP when ready Arrange follow up:   Follow up per Primary Care planned      Low-dose CT lung cancer screening is recommended for patients who are 40-53 years of age with a 20+ pack-year history of smoking and who are currently smoking or quit <=15 years ago. No coughing up blood  No  unintentional weight loss of > 15 pounds in the last 6 months - pt is eligible for scanning yearly until age 50 > deferred to PCP         Each maintenance medication was reviewed in detail including emphasizing most importantly the difference between maintenance and prns and under what circumstances the prns are to be triggered using an action plan format where appropriate.  Total time for H and P, chart review, counseling, reviewing hfa  device(s) and generating customized AVS unique to this office visit / same day charting = 50 min new pt eval   for multiple  refractory respiratory  symptoms of uncertain etiology          AVS  Patient Instructions  The key to effective treatment for your cough is eliminating the non-stop cycle of cough you're stuck in long enough to let your airway heal completely and then see if there is anything still making you cough once you stop the cough suppression, but this should take no more than 5 days to figure out  First take delsym two tsp every 12 hours and supplement if needed with  percocet up to 1 every 4 hours to suppress the urge to cough at all or even clear your throat. Swallowing water or using ice chips/non mint and menthol containing candies (such as lifesavers or sugarless jolly ranchers) are also effective.  You should rest your voice and avoid activities that you know make you cough.  Once you have eliminated the cough for 3 straight days try reducing percoet   then the delsym as tolerated.     Protonix (pantoprazole) Take 30-60 min before first meal of the day and Pepcid 20 mg one bedtime   until cough is completely gone for at least a week without the need for cough suppression then  ok to try off   GERD (REFLUX)  is an extremely common cause of respiratory symptoms, many times with no significant heartburn at all.    It can be treated with medication, but also with lifestyle changes including avoidance of late meals, excessive alcohol, smoking  cessation, and avoid fatty foods, chocolate, peppermint, colas, red wine, and acidic juices such as orange juice.  NO MINT OR MENTHOL PRODUCTS SO NO COUGH DROPS  - Ludiens Pectin based  USE HARD CANDY INSTEAD (jolley ranchers or Stover's or Lifesavers (all available in sugarless versions) NO OIL BASED VITAMINS - use powdered substitutes.  The key is to stop smoking completely before smoking completely stops you!              Ozell America, MD 03/26/2024

## 2024-03-26 NOTE — Assessment & Plan Note (Addendum)
 5  min discussion re active cigarette smoking in addition to office E&M  Ask about tobacco use:   ongoing Advise quitting     I reviewed the Fletcher curve with the patient that basically indicates that  if you quit smoking when your best day FEV1 is still well preserved (as is likely still   the case here)  it is highly unlikely you will progress to severe disease and informed the patient there was  no medication on the market that has proven to alter the curve/ its downward trajectory  or the likelihood of progression of their disease(unlike other chronic medical conditions such as atheroclerosis where we do think we can change the natural hx with risk reducing meds)    Therefore stopping smoking and maintaining abstinence are  the most important aspects of her care, not choice of inhalers or for that matter, Pulmonary doctors.  Treatment other than smoking cessation  is entirely directed by severity of symptoms and focused also on reducing exacerbations, not attempting to change the natural history of the disease. Should either issue become problematic, then escalation of maintenance and prn resp meds may be warranted the punishment needs to fit the crime   Assess willingness:  Not committed at this point Assist in quit attempt:  Per PCP when ready Arrange follow up:   Follow up per Primary Care planned      Low-dose CT lung cancer screening is recommended for patients who are 25-91 years of age with a 20+ pack-year history of smoking and who are currently smoking or quit <=15 years ago. No coughing up blood  No unintentional weight loss of > 15 pounds in the last 6 months - pt is eligible for scanning yearly until age 48 > deferred to PCP         Each maintenance medication was reviewed in detail including emphasizing most importantly the difference between maintenance and prns and under what circumstances the prns are to be triggered using an action plan format where appropriate.  Total  time for H and P, chart review, counseling, reviewing hfa  device(s) and generating customized AVS unique to this office visit / same day charting = 50 min new pt eval   for multiple  refractory respiratory  symptoms of uncertain etiology

## 2024-03-26 NOTE — Assessment & Plan Note (Addendum)
 Onset reported around 2010 - PFT's  12/04/2007  FEV1 1.34  (57 % ) ratio 0.73    with DLCO  15.6 (64%)   and FV curve min concave   @ wt 188  No evidence of copd on prior pfts well w/in her range of symptom onset so nothing needed here until we clear up her cough as most inhalers, as she has found out, make cough worse.

## 2024-03-26 NOTE — Patient Instructions (Addendum)
 The key to effective treatment for your cough is eliminating the non-stop cycle of cough you're stuck in long enough to let your airway heal completely and then see if there is anything still making you cough once you stop the cough suppression, but this should take no more than 5 days to figure out  First take delsym two tsp every 12 hours and supplement if needed with  percocet up to 1 every 4 hours to suppress the urge to cough at all or even clear your throat. Swallowing water or using ice chips/non mint and menthol containing candies (such as lifesavers or sugarless jolly ranchers) are also effective.  You should rest your voice and avoid activities that you know make you cough.  Once you have eliminated the cough for 3 straight days try reducing percoet   then the delsym as tolerated.     Protonix (pantoprazole) Take 30-60 min before first meal of the day and Pepcid 20 mg one bedtime   until cough is completely gone for at least a week without the need for cough suppression then ok to try off   GERD (REFLUX)  is an extremely common cause of respiratory symptoms, many times with no significant heartburn at all.    It can be treated with medication, but also with lifestyle changes including avoidance of late meals, excessive alcohol, smoking cessation, and avoid fatty foods, chocolate, peppermint, colas, red wine, and acidic juices such as orange juice.  NO MINT OR MENTHOL PRODUCTS SO NO COUGH DROPS  - Ludiens Pectin based  USE HARD CANDY INSTEAD (jolley ranchers or Stover's or Lifesavers (all available in sugarless versions) NO OIL BASED VITAMINS - use powdered substitutes.  The key is to stop smoking completely before smoking completely stops you!

## 2024-03-26 NOTE — Assessment & Plan Note (Addendum)
 Smoker with onset early sept 2025 - MRI sinus nl 02/12/24  - 03/26/2024 prominent upper airway wheezing so rx as UACS with cough elimination / max gerd rx >>>  Of the three most common causes of  Sub-acute / recurrent or chronic cough, only one (GERD)  can actually contribute to/ trigger  the other two (asthma and post nasal drip syndrome)  and perpetuate the cylce of cough.  While not intuitively obvious, many patients with chronic low grade reflux do not cough until there is a primary insult that disturbs the protective epithelial barrier and exposes sensitive nerve endings.   This is typically viral but can due to PNDS and  either may apply here.    >>>  The point is that once this occurs, it is difficult to eliminate the cycle  using anything but a maximally effective acid suppression regimen at least in the short run, accompanied by an appropriate diet to address non acid GERD and control / eliminate the cough itself for at least 3 days with percocet up to 1 q4h and f/u by ENT prn

## 2024-03-29 ENCOUNTER — Ambulatory Visit

## 2024-04-03 ENCOUNTER — Ambulatory Visit: Payer: Self-pay

## 2024-04-03 ENCOUNTER — Telehealth: Payer: Self-pay

## 2024-04-03 NOTE — Telephone Encounter (Signed)
 Copied from CRM [id number]. Topic: Clinical - Medication Question >> Apr 03, 2024  8:40 AM Essie A wrote: Reason for CRM: Patient was diagnosed with Thalia on her last visit.  Dr. Darlean prescribed 2 medications for her to take, 1 medication in the morning and 1 in the evening.  Doesn't seem to be helping.  She is coughing really bad, with production.  Please return her call at [phone].  Thanks. Reason for Disposition  Cough  Answer Assessment - Initial Assessment Questions Patient stated she was given 2 medications by Dr Darlean, has eliminated soda, chocolate, states she's still having coffee in the AM. States the 2 medications she was given are not working. Patient states she feels like she's coughing her head off. States no improvement, thinks its just as worse at it was when she went in on 10/28. Patient states what she was given is not strong enough, she would like something else sent to [facility] on file. Please advise.   1. ONSET: When did the cough begin?      Weeks  2. SEVERITY: How bad is the cough today?      Coughing my head off  3. SPUTUM: Describe the color of your sputum (e.g., none, dry cough; clear, white, yellow, green)     Clear  4. HEMOPTYSIS: Are you coughing up any blood? If Yes, ask: How much? (e.g., flecks, streaks, tablespoons, etc.)     Denies  5. DIFFICULTY BREATHING: Are you having difficulty breathing? If Yes, ask: How bad is it? (e.g., mild, moderate, severe)      Denies  6. FEVER: Do you have a fever? If Yes, ask: What is your temperature, how was it measured, and when did it start?     Denies  7. OTHER SYMPTOMS: Do you have any other symptoms? (e.g., runny nose, wheezing, chest pain)       Denies  Protocols used: Cough - Acute Productive-A-AH

## 2024-04-03 NOTE — Telephone Encounter (Signed)
 Copied from CRM #[id number]. Topic: Clinical - Red Word Triage >> Apr 03, 2024  9:56 AM Russell PARAS wrote: Red Word that prompted transfer to Nurse Triage:   Severe cough, with mucus production Was prescribed 2 meds for GERD by Wert Has not been beneficial  Pt of Wert

## 2024-04-03 NOTE — Telephone Encounter (Signed)
 Coughing still since last week until she chokes, every breath she is coughing, she is still using medications as she was told and going by instructions. She has no energy and no motivation, she feels terrible -pt states she does not want to come into the clinic but she will do whatever she has to do but she is unable to come to [place] office.   Please advise

## 2024-04-03 NOTE — Telephone Encounter (Signed)
 Pt returned call because she said she got disconnected. She states she has been doing everything Dr. Darlean told her to do. She states that she is getting choked up on clear phlegm in the middle of the night and having to sit straight up to finally be able to clear it. She did say she is not taking the percocet because she's afraid she'll be so sleepy she won't be able to clear her secretions and choke while sleeping.   Please advise on next steps for patient.

## 2024-04-04 ENCOUNTER — Ambulatory Visit: Admitting: Internal Medicine

## 2024-04-04 ENCOUNTER — Encounter: Payer: Self-pay | Admitting: Internal Medicine

## 2024-04-04 NOTE — Telephone Encounter (Signed)
 Pt does not want to see a new dr states she has 5 Dr and is not interested in that, informed pt I will get her scheduled.  Pt needs appt with Wert (please make sure she brings all meds and is aware this is the [place] office)

## 2024-04-04 NOTE — Telephone Encounter (Signed)
 Called patient and got her scheduled today (11/6) w/ Wert.

## 2024-04-05 NOTE — Telephone Encounter (Signed)
 To my understanding pt went to [place] office yesterday instead or [place] for appt with dr wert - unsure is she was seen by another dr or not.

## 2024-04-11 ENCOUNTER — Ambulatory Visit: Payer: Self-pay

## 2024-04-11 NOTE — Telephone Encounter (Signed)
 FYI Only or Action Required?: FYI only for provider: FYI.  Patient is followed in Pulmonology for Cough, last seen on 03/26/2024 by Darlean Ozell NOVAK, MD.  Called Nurse Triage reporting Cough.  Symptoms began several months ago.  Interventions attempted: Prescription medications: Famotidine, Pantoprazole.  Symptoms are: unchanged.  Triage Disposition: See PCP When Office is Open (Within 3 Days)  Patient/caregiver understands and will follow disposition?: No, refuses disposition Reason for Disposition  Cough has been present for > 3 weeks  Answer Assessment - Initial Assessment Questions Prescribed 2 meds for GERD and not working. Patient scheduled on 11/6 with Dr. Darlean, patient states she went to the [facility] office and he was not there and did not know he was at [facility]. Offered patient an appointment and she stated what's the point if nothing has changed. She stated she's just going to stop taking the 2 medications he prescribed and just deal with the coughing. Then stated she's not refusing to see him but nothing has changed. Please advise.   1. ONSET: When did the cough begin?      2 months  2. SEVERITY: How bad is the cough today?      Always coughing  3. SPUTUM: Describe the color of your sputum (e.g., none, dry cough; clear, white, yellow, green)     Clear  4. DIFFICULTY BREATHING: Are you having difficulty breathing? If Yes, ask: How bad is it? (e.g., mild, moderate, severe)      Denies  5. OTHER SYMPTOMS: Do you have any other symptoms? (e.g., runny nose, wheezing, chest pain)       Denies chest pain  Protocols used: Cough - Acute Productive-A-AH  Copied from CRM #[id number]. Topic: Clinical - Red Word Triage >> Apr 11, 2024  1:00 PM Ismael A wrote: [facility] that prompted transfer to Nurse Triage: patient states she has been taking pantoprazole and famotidine for cough for about two weeks and states her cough has not been getting any better - states she  feels like she's choking with phlegm

## 2024-04-12 NOTE — Telephone Encounter (Signed)
 Prescribed 2 meds for GERD and not working. Patient scheduled on 11/6 with Dr. Darlean, patient states she went to the [facility] office and he was not there and did not know he was at [facility]. Offered patient an appointment and she stated what's the point if nothing has changed. She stated she's just going to stop taking the 2 medications he prescribed and just deal with the coughing. Then stated she's not refusing to see him but nothing has changed. Please advise.    Please advise

## 2024-04-18 NOTE — Telephone Encounter (Signed)
 Called to relay what dr wert mentioned regarding pts cough - pt states that she thinks she has emphysema and she does not have GERD per her pcp and she will let us  know if she needs us  in the future .

## 2024-05-02 ENCOUNTER — Encounter: Payer: Self-pay | Admitting: Neurology

## 2024-05-02 ENCOUNTER — Ambulatory Visit: Admitting: Neurology

## 2024-05-02 VITALS — BP 133/80 | HR 74 | Ht 64.0 in | Wt 160.5 lb

## 2024-05-02 DIAGNOSIS — M3130 Wegener's granulomatosis without renal involvement: Secondary | ICD-10-CM

## 2024-05-02 DIAGNOSIS — F17210 Nicotine dependence, cigarettes, uncomplicated: Secondary | ICD-10-CM

## 2024-05-02 DIAGNOSIS — H5711 Ocular pain, right eye: Secondary | ICD-10-CM

## 2024-05-02 DIAGNOSIS — E039 Hypothyroidism, unspecified: Secondary | ICD-10-CM

## 2024-05-02 MED ORDER — ZONISAMIDE 50 MG PO CAPS: ORAL_CAPSULE | ORAL | 5 refills | Status: AC

## 2024-05-02 NOTE — Progress Notes (Signed)
 [facility]  PATIENT: [name] DOB: 01-15-1951  REFERRING DOCTOR OR PCP: Dr. Verdia SOURCE: Patient, notes from primary care, imaging and lab reports.  MRI images personally reviewed  _________________________________   HISTORICAL  CHIEF COMPLAINT:  Chief Complaint  Patient presents with   New Patient (Initial Visit)    Rm10, alone,  referral for Headaches, right eye pain/Dr. Lequita Verdia [facility] [phone]    HISTORY OF PRESENT ILLNESS:  I had the pleasure of seeing patient, [name], at [facility] for neurologic consultation regarding her right eye/orbital pain  She is a 73 year old woman who reports pain in the right eye.  Pain is a dull ache and does not change with movements.   It fluctuates in intensity but is always present.  Aleve and Tylenol  only help slightly.   Antibiotic eye drops had not helped.    She has seen several ophthalmologists who have told her that the eye is fine.   ESR/CRP was normal 02/22/24.  No evidence of optic neuritis or uveitis.   VA has not changed.   Color vision is symmetric.    She was placed on pregabalin but felt poorly.    She is on low dose steroid chronically and a short course of a higher dose was not helpful.    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  Also on weekly methotrexate 15 mg and prednisone  5 mg daily .   About 8 years ago she had chest pain and other symptoms.  She sees Dr. Mai.     She gets occasioanl mild   Vascular risks:  She denies HTN, CAD/AFib, DM.  She is in the process of quitting smoking  MRI Orbits 02/22/24 was normal.  MRI brain 02/22/24 showed miderate chronic microvascular ischemic change but no acute findings.     REVIEW OF SYSTEMS: Constitutional: No fevers, chills, sweats, or change in appetite Eyes:  No visual changes, double vision, eye pain Ear, nose and throat: No hearing loss, ear pain, nasal congestion, sore throat Cardiovascular: No chest pain, palpitations Respiratory: Occasional shortness of breath.  She has Wegener's granulomatosis with pulmonary manifestations  GastrointestinaI: No nausea, vomiting, diarrhea, abdominal pain, fecal incontinence.  She has GERD. Genitourinary:  No dysuria, urinary retention or frequency.  No nocturia. Musculoskeletal:  No neck pain, back pain Integumentary: No rash, pruritus, skin lesions Neurological: as above Psychiatric: No depression at this time.  No anxiety Endocrine: No palpitations, diaphoresis, change in appetite, change in weigh or increased thirst Hematologic/Lymphatic:  No anemia, purpura, petechiae. Allergic/Immunologic: No itchy/runny eyes, nasal congestion, recent allergic reactions, rashes  ALLERGIES: Allergies  Allergen Reactions   Rituxan [Rituximab] Shortness Of Breath   Codeine Other (See Comments)    Chest pain   Erythromycin Base     Burned both eyes.    HOME MEDICATIONS:  Current Outpatient Medications:    acetaminophen  (TYLENOL ) 500 MG tablet, Take 1,000 mg by mouth every 6 (six) hours as needed (pain)., Disp: , Rfl:    Adalimumab (HUMIRA PEN [place]), Inject 1 pen  into the skin every 14 (fourteen) days., Disp: , Rfl:    Biotin 800 MCG TABS, 1 tablet Orally Once a day for 30 day(s), Disp: , Rfl:    Cholecalciferol (VITAMIN D) 2000 UNITS tablet, Take 2,000 Units by mouth daily., Disp: ,  Rfl:    Cyanocobalamin  1000 MCG TBCR, 1 tablet Orally Once a day, Disp: , Rfl:    folic acid (FOLVITE) 1 MG tablet, Take 3 mg by mouth daily., Disp: , Rfl:    levothyroxine  (SYNTHROID ) 50 MCG tablet, Take 50 mcg by mouth every morning., Disp: , Rfl:    methotrexate (RHEUMATREX) 5 MG tablet, Take 30 mg by mouth once a week. Caution: Chemotherapy. Protect from light., Disp: , Rfl:    pantoprazole  (PROTONIX ) 40 MG tablet, Take 1 tablet (40  mg total) by mouth daily. Take 30-60 min before first meal of the day, Disp: 30 tablet, Rfl: 2   polyethylene glycol-electrolytes (NULYTELY) 420 g solution, Take 4,000 mLs by mouth once., Disp: , Rfl:    predniSONE  (DELTASONE ) 5 MG tablet, Take 5 mg by mouth daily., Disp: , Rfl:    sulfamethoxazole-trimethoprim (BACTRIM) 400-80 MG tablet, Take 2 tablets by mouth daily., Disp: , Rfl:    Turmeric (QC TUMERIC COMPLEX PO), Take 1,000 mg by mouth daily., Disp: , Rfl:    valACYclovir (VALTREX) 500 MG tablet, Take 500 mg by mouth daily., Disp: , Rfl:    zonisamide (ZONEGRAN) 50 MG capsule, Take one po every day x 3 days then increase to 2 po qHS, Disp: 60 capsule, Rfl: 5  PAST MEDICAL HISTORY: 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 ([facility])    PAST SURGICAL HISTORY: Past Surgical History:  Procedure Laterality Date   APPENDECTOMY  1980s   CHOLECYSTECTOMY  1980s   COLONOSCOPY  05/31/2011   hyperplastic polyp, diverticulosis rpt 10 yrs O[place]ta)   LUNG SURGERY Right    TONSILLECTOMY  child   VIDEO ASSISTED THORACOSCOPY (VATS)/THOROCOTOMY Right 05/31/2007   removal of scar tissue; nodule biopsy x3    FAMILY HISTORY: Family History  Adopted: Yes  Problem Relation Age of Onset   Cancer Mother 66       breast   Breast cancer Mother    CAD Father        MI   Colon cancer Neg Hx    Colon polyps Neg Hx    Crohn's disease Neg Hx    Esophageal cancer Neg Hx    Rectal cancer Neg Hx    Stomach cancer Neg Hx    Ulcerative colitis Neg Hx     SOCIAL HISTORY: Social History   Socioeconomic History   Marital status: Divorced    Spouse name: Not on file   Number of children: 1   Years of education: Not on file   Highest education level: Not on  file  Occupational History   Occupation: Production Manager: [employer]  Tobacco Use   Smoking status: Every Day    Current packs/day: 1.00    Average packs/day: 1 pack/day for 55.9 years (55.9 ttl pk-yrs)    Types: Cigarettes    Start date: 05/30/1968   Smokeless tobacco: Never   Tobacco comments:    Smoking 1 1/2 pack a day. 03/26/2024.  Vaping Use   Vaping status: Never Used  Substance and Sexual Activity   Alcohol use: Yes    Alcohol/week: 42.0 standard drinks of alcohol    Types: 42 Cans of beer per week  Comment: beer 24 a week   Drug use: No   Sexual activity: Not Currently  Other Topics Concern   Not on file  Social History Narrative   Husband passed away 05-Feb-2012 from colon cancer.   Son disabled, lives with pt.   Grandsons (67 yo twin) at home frequently   Occ: investment banker, corporate    Edu: HS   Activity: walks at her property, with inspections   Diet: some water, fruits/vegetables daily   Social Drivers of Corporate Investment Banker Strain: Not on file  Food Insecurity: Not on file  Transportation Needs: Not on file  Physical Activity: Not on file  Stress: Not on file  Social Connections: Not on file  Intimate Partner Violence: Not on file       PHYSICAL EXAM  Vitals:   05/02/24 1252  BP: 133/80  Pulse: 74  Weight: 160 lb 8 oz (72.8 kg)  Height: 5' 4 (1.626 m)    Body mass index is 27.55 kg/m.   General: The patient is well-developed and well-nourished and in no acute distress  HEENT:  Head is [place]/AT.  Sclera are anicteric.  Funduscopic exam shows normal optic discs and retinal vessels.  Neck: No carotid bruits are noted.  The neck is nontender.  Cardiovascular: The heart has a regular rate and rhythm with a normal S1 and S2. There were no murmurs, gallops or rubs.    Skin: Extremities are without rash or  edema.  Musculoskeletal:  Back is nontender  Neurologic Exam  Mental status: The patient is alert and oriented x 3  at the time of the examination. The patient has apparent normal recent and remote memory, with an apparently normal attention span and concentration ability.   Speech is normal.  Cranial nerves: Extraocular movements are full. Pupils are equal, round, and reactive to light and accomodation.  Visual fields are full.   there is good facial sensation to soft touch bilaterally.Facial strength is normal.  Trapezius and sternocleidomastoid strength is normal. No dysarthria is noted.  The tongue is midline, and the patient has symmetric elevation of the soft palate. No obvious hearing deficits are noted.  Motor:  Muscle bulk is normal.   Tone is normal. Strength is  5 / 5 in all 4 extremities.   Sensory: Sensory testing is intact to pinprick, soft touch and vibration sensation in all 4 extremities.  Coordination: Cerebellar testing reveals good finger-nose-finger and heel-to-shin bilaterally.  Gait and station: Station is normal.   Gait is normal. Tandem gait is normal. Romberg is negative.   Reflexes: Deep tendon reflexes are symmetric and normal bilaterally.       DIAGNOSTIC DATA (LABS, IMAGING, TESTING) - I reviewed patient records, labs, notes, testing and imaging myself where available.  Lab Results  Component Value Date   WBC 8.2 02/22/2024   HGB 14.6 02/22/2024   HCT 43.6 02/22/2024   MCV 106.6 (H) 02/22/2024   PLT 261 02/22/2024      Component Value Date/Time   NA 139 02/22/2024 1233   K 4.3 02/22/2024 1233   CL 100 02/22/2024 1233   CO2 29 02/22/2024 1233   GLUCOSE 119 (H) 02/22/2024 1233   BUN <5 (L) 02/22/2024 1233   CREATININE 0.76 02/22/2024 1233   CREATININE 0.9 09/07/2012 0000   CALCIUM 8.8 (L) 02/22/2024 1233   PROT 5.8 (L) 11/11/2007 0520   ALBUMIN 2.5 (L) 11/11/2007 0520   AST 15 09/07/2012 0000   ALT 18 09/07/2012 0000  ALKPHOS 77 09/07/2012 0000   BILITOT 0.5 09/07/2012 0000   GFRNONAA >60 02/22/2024 1233   GFRAA 111 11/29/2007 1653   Lab Results   Component Value Date   CHOL 258 (H) 02/11/2013   HDL 47.70 02/11/2013   LDLDIRECT 183.0 02/11/2013   TRIG 183.0 (H) 02/11/2013   CHOLHDL 5 02/11/2013   No results found for: HGBA1C No results found for: VITAMINB12 Lab Results  Component Value Date   TSH 3.14 02/11/2013       ASSESSMENT AND PLAN  Retro-orbital pain of right eye  Granulomatosis with polyangiitis, unspecified whether renal involvement (HCC)  Cigarette smoker  Hypothyroidism, unspecified type   In summary, [name] is a 73 year old woman with right orbital pain.  Her exam was essentially normal.  Additionally, MRI of the brain and orbits showed no orbital process or source of right orbital pain.  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  Lab work was fine.  No additional testing is needed at this point as comprehensive evaluation has been negative. Trial of Zonisamide 50 to 100 mg po qHS.  If no improvement after a few weeks, consider a tricyclic (nortriptyline) She will return to see us  as needed based on the response or if she has significant new or worsening neurologic symptoms.  Thank you for asking me to see this patient.  Please let me know if I can be of further assistance with her or other patients in the future.    Bethenny Losee A. Vear, MD, [facility] 05/02/2024, 1:48 PM Certified in Neurology, Clinical Neurophysiology, Sleep Medicine and Neuroimaging  [facility] [address] [place], [place] [address] [phone]

## 2024-05-13 ENCOUNTER — Telehealth: Payer: Self-pay | Admitting: Neurology

## 2024-05-13 ENCOUNTER — Other Ambulatory Visit: Payer: Self-pay | Admitting: Neurology

## 2024-05-13 MED ORDER — NORTRIPTYLINE HCL 25 MG PO CAPS: 25.0000 mg | ORAL_CAPSULE | Freq: Every day | ORAL | 5 refills | Status: DC

## 2024-05-13 NOTE — Telephone Encounter (Signed)
 Pt has called back as a result of not hearing from anyone yet.  Pt states she is vomiting and feeling sick.  Pt states Dr Vear mentioned a plan B , pt would like a call to discuss that because current medication has not helped.

## 2024-05-13 NOTE — Telephone Encounter (Signed)
 Pt called to request to speak to Nurse about eye pain. Pt informed that  medication is not helping her eye lids are still swollen and wanted to speak to nurse or MD about a different treatment   Pt is currently taking   zonisamide  (ZONEGRAN ) 50 MG capsule

## 2024-05-13 NOTE — Telephone Encounter (Signed)
 I called pt and she says that she has not noticed any great difference since she has been on zonegran  eye pain(level 9 to 7)  Vomiting daily, no appetite. Very slight eyelid swelling change, no redness.  Been on for about 10-11 days.  Try another nortriptyline ?  Is so please give only 30 tabs.

## 2024-05-14 NOTE — Telephone Encounter (Signed)
 I called pt relayed to stop the zonegran .  She can start the nortriptyline  when back to her baseline. (No vomiting).  I went over SE (common). She will get also at pharmacy.  Appreciated call back.

## 2024-06-05 ENCOUNTER — Other Ambulatory Visit: Payer: Self-pay | Admitting: Neurology

## 2024-06-20 ENCOUNTER — Other Ambulatory Visit: Payer: Self-pay | Admitting: Internal Medicine

## 2024-06-20 DIAGNOSIS — R053 Chronic cough: Secondary | ICD-10-CM

## 2024-06-21 ENCOUNTER — Ambulatory Visit: Admitting: Diagnostic Neuroimaging

## 2024-06-21 ENCOUNTER — Telehealth: Payer: Self-pay | Admitting: Neurology

## 2024-06-21 NOTE — Telephone Encounter (Signed)
 Pt reports that the nortriptyline  (PAMELOR ) 25 MG capsule has helped and although there is still some pain and redness in eye it is not as bad as it was.  Pt is asking for a call to discuss the idea of increasing her nightly amount of the medication, please call.

## 2024-06-25 ENCOUNTER — Other Ambulatory Visit: Payer: Self-pay | Admitting: Neurology

## 2024-06-25 MED ORDER — NORTRIPTYLINE HCL 25 MG PO CAPS: 25.0000 mg | ORAL_CAPSULE | Freq: Two times a day (BID) | ORAL | 3 refills | Status: DC

## 2024-06-25 NOTE — Telephone Encounter (Signed)
 Would like to increase pamelor 

## 2024-06-25 NOTE — Telephone Encounter (Signed)
 I CALLED AND RELAYED THE FOLLOWING INFORMATION: sent in a prescription for a higher dose. The dose will be 2 pills a day. She could try taking it both at the same time or taking 1 in the afternoon and 1 at bedtime.   PT VOICED GRATITUDE AND UNDERSTANDING

## 2024-07-05 ENCOUNTER — Other Ambulatory Visit: Payer: Self-pay

## 2024-07-05 MED ORDER — NORTRIPTYLINE HCL 25 MG PO CAPS: 25.0000 mg | ORAL_CAPSULE | Freq: Two times a day (BID) | ORAL | 3 refills | Status: AC

## 2024-07-05 NOTE — Telephone Encounter (Signed)
 Pt called stating  the she reached out to pharmacy to get refill of medication  due to increase  . Pt pharmacy stated they will need new order to be sent to them due to on there end its showing refill to soon. Pt wanted to know if she would be able to get medication today    nortriptyline  (PAMELOR ) 25 MG capsule  [id number] - , [place] - [address] (Ph: [phone])

## 2024-07-05 NOTE — Telephone Encounter (Signed)
 "  Refill sent as requested   "
